# Patient Record
Sex: Female | Born: 1955 | Race: White | Hispanic: No | Marital: Married | Smoking: Never smoker
Health system: Southern US, Community
[De-identification: ages and names within clinical notes are randomized; demographics above are authoritative.]

## PROBLEM LIST (undated history)

## (undated) DIAGNOSIS — N200 Calculus of kidney: Secondary | ICD-10-CM

## (undated) DIAGNOSIS — B009 Herpesviral infection, unspecified: Secondary | ICD-10-CM

## (undated) DIAGNOSIS — G43909 Migraine, unspecified, not intractable, without status migrainosus: Secondary | ICD-10-CM

## (undated) DIAGNOSIS — K589 Irritable bowel syndrome without diarrhea: Secondary | ICD-10-CM

## (undated) DIAGNOSIS — Z780 Asymptomatic menopausal state: Secondary | ICD-10-CM

## (undated) DIAGNOSIS — K219 Gastro-esophageal reflux disease without esophagitis: Secondary | ICD-10-CM

## (undated) DIAGNOSIS — M810 Age-related osteoporosis without current pathological fracture: Secondary | ICD-10-CM

## (undated) HISTORY — DX: Asymptomatic menopausal state: Z78.0

## (undated) HISTORY — PX: BREAST CYST ASPIRATION: SHX578

## (undated) HISTORY — DX: Migraine, unspecified, not intractable, without status migrainosus: G43.909

## (undated) HISTORY — DX: Age-related osteoporosis without current pathological fracture: M81.0

## (undated) HISTORY — PX: ABDOMINAL HYSTERECTOMY: SHX81

## (undated) HISTORY — DX: Irritable bowel syndrome, unspecified: K58.9

## (undated) HISTORY — DX: Calculus of kidney: N20.0

## (undated) HISTORY — DX: Gastro-esophageal reflux disease without esophagitis: K21.9

## (undated) HISTORY — PX: BREAST LUMPECTOMY: SHX2

---

## 1978-05-01 HISTORY — PX: BREAST CYST EXCISION: SHX579

## 1998-05-01 HISTORY — PX: BREAST BIOPSY: SHX20

## 2001-05-01 HISTORY — PX: CYSTECTOMY: SUR359

## 2011-08-24 ENCOUNTER — Inpatient Hospital Stay: Payer: Self-pay | Admitting: *Deleted

## 2011-08-24 LAB — URINALYSIS, COMPLETE
Blood: NEGATIVE
Glucose,UR: NEGATIVE mg/dL (ref 0–75)
Specific Gravity: 1.008 (ref 1.003–1.030)
WBC UR: <1 /HPF (ref 0–5)

## 2011-08-24 LAB — BASIC METABOLIC PANEL
Anion Gap: 10 (ref 7–16)
Calcium, Total: 9.1 mg/dL (ref 8.5–10.1)
Chloride: 110 mmol/L — ABNORMAL HIGH (ref 98–107)
Co2: 24 mmol/L (ref 21–32)
EGFR (Non-African Amer.): >60
Glucose: 122 mg/dL — ABNORMAL HIGH (ref 65–99)
Sodium: 144 mmol/L (ref 136–145)

## 2011-08-24 LAB — CK TOTAL AND CKMB (NOT AT ARMC): CK, Total: 105 U/L (ref 21–215)

## 2011-08-24 LAB — CBC
MCH: 30.4 pg (ref 26.0–34.0)
MCHC: 33.3 g/dL (ref 32.0–36.0)
WBC: 6.9 10*3/uL (ref 3.6–11.0)

## 2011-08-25 LAB — LIPID PANEL
Cholesterol: 153 mg/dL (ref 0–200)
Ldl Cholesterol, Calc: 91 mg/dL (ref 0–100)
VLDL Cholesterol, Calc: 14 mg/dL (ref 5–40)

## 2011-08-25 LAB — TROPONIN I
Troponin-I: 0.02 ng/mL
Troponin-I: <0.02 ng/mL

## 2011-08-25 LAB — BASIC METABOLIC PANEL
Chloride: 111 mmol/L — ABNORMAL HIGH (ref 98–107)
Co2: 21 mmol/L (ref 21–32)
Creatinine: 0.48 mg/dL — ABNORMAL LOW (ref 0.60–1.30)
EGFR (African American): >60
EGFR (Non-African Amer.): >60
Osmolality: 283 (ref 275–301)
Potassium: 3.8 mmol/L (ref 3.5–5.1)
Sodium: 143 mmol/L (ref 136–145)

## 2011-08-25 LAB — CK TOTAL AND CKMB (NOT AT ARMC)
CK, Total: 78 U/L (ref 21–215)
CK, Total: 78 U/L (ref 21–215)
CK-MB: 1.5 ng/mL (ref 0.5–3.6)
CK-MB: 1.8 ng/mL (ref 0.5–3.6)

## 2011-09-06 ENCOUNTER — Ambulatory Visit: Payer: Self-pay | Admitting: Internal Medicine

## 2012-06-04 ENCOUNTER — Encounter: Payer: Self-pay | Admitting: *Deleted

## 2012-06-10 ENCOUNTER — Ambulatory Visit: Payer: Self-pay | Admitting: Cardiovascular Disease

## 2012-08-05 ENCOUNTER — Ambulatory Visit: Payer: Self-pay | Admitting: Cardiovascular Disease

## 2012-09-18 ENCOUNTER — Ambulatory Visit: Payer: Self-pay | Admitting: Cardiovascular Disease

## 2013-11-03 ENCOUNTER — Ambulatory Visit: Payer: Self-pay | Admitting: Gastroenterology

## 2013-11-05 LAB — PATHOLOGY REPORT

## 2014-08-23 NOTE — Consult Note (Signed)
Brief Consult Note: Diagnosis: SVT.   Patient was seen by consultant.   Consult note dictated.   Recommend further assessment or treatment.   Orders entered.   Discussed with Attending MD.   Comments: IMP SVT Tachycardia Palp . PLAN ASA 81 qd B-blockers Metoprolol 25 bib Increase activity ECHO as inpt or out I do not rec antiarrthymic at this point I do not rec EP/Ablation unless recurrence Probably ok to d/c in 24hr F/U cardiology 1-2 weeks Reduce caffenine/OTC allery meds No ETOH/Smoking.  Electronic Signatures: Lujean Amel D (MD)  (Signed 27-Apr-13 06:49)  Authored: Brief Consult Note   Last Updated: 27-Apr-13 06:49 by Yolonda Kida (MD)

## 2014-08-23 NOTE — H&P (Signed)
PATIENT NAME:  Susan Hickman, Susan Hickman MR#:  [id number] DATE OF BIRTH:  07/11/1955  DATE OF ADMISSION:  08/24/2011  ER REFERRING PHYSICIAN: Conni Slipper, MD   PRIMARY CARE PHYSICIAN: Calla Kicks, MD   CHIEF COMPLAINT: Weakness, dizziness, palpitations.   HISTORY OF PRESENT ILLNESS: The patient is a 59 year old female with past medical history of arthritis, allergies, who since yesterday has been feeling an extreme amount of fatigue. Today while at work she developed sudden onset of palpitations associated with lightheadedness and dizziness. The patient felt like she was going to pass out. Therefore, she sat down, which did not relieve the symptoms. She tried to drink some orange juice but was not able to eat or drink anything. She tried to get up and walk and symptoms and pain subsided. Her coworker called EMS, who found the patient to be in supraventricular tachycardia. The patient was given 6 mg of adenosine which converted her back to normal sinus rhythm. The patient denies having any problems with her heart in the past. She has had palpitations in the past which were associated with stress. She has been having a lot of anxiety and stress in her life recently.   INTEGUMENT: She denies excessive use of caffeine. She drinks about two cups of coffee a day and drinks soda very occasionally. She denies taking any diet pills or stimulants or any drug abuse.   ALLERGIES: Talwin, codeine, shellfish.   MEDICATIONS:  1. Multivitamins.  2. Celebrex 200 mg daily as needed.  3. Claritin daily.  4. Flonase daily.   PAST MEDICAL HISTORY:  1. Kidney stones.  2. Allergies. 3. Osteoarthritis.  PAST SURGICAL HISTORY: Hysterectomy.   SOCIAL HISTORY: Denies any history of smoking, alcohol or drug abuse. She works as a Educational psychologist.  She lives with her boyfriend.   FAMILY HISTORY: Mother had osteoarthritis, osteoporosis, hypertension, diabetes, colon cancer, myocardial infarction, status post coronary artery bypass  graft. Father had hypertension, colon cancer, leukemia. Paternal grandmother had cerebrovascular accident. Maternal grandmother had myocardial infarction.    REVIEW OF SYSTEMS: CONSTITUTIONAL: Denies any fever. Reports fatigue and weakness. EYES: Denies any blurred or double vision. ENT: Denies any tinnitus, ear pain. RESPIRATORY: Denies any cough, wheezing. CARDIOVASCULAR: Denies any chest pain. Reports palpitations. GI: Denies any nausea, vomiting, diarrhea, or abdominal pain. GU: Denies any dysuria or hematuria. ENDOCRINE: Denies polyuria, nocturia. HEMATOLOGIC/LYMPHATIC: Denies any anemia or easy bruisability. INTEGUMENTARY: Denies any acne or rash. MUSCULOSKELETAL: Denies any swelling, gout. NEUROLOGICAL: Denies any numbness, weakness. PSYCHIATRIC: Reports anxiety and a lot of stress recently.   PHYSICAL EXAMINATION:  VITAL SIGNS: Temperature 98, heart rate 110, respiratory rate 20, blood pressure 122/77, pulse oximetry 99% on room air.   GENERAL: The patient is a 60 year old female who is mildly anxious, sitting in bed, not having any acute distress.   HEENT: Head: Atraumatic, normocephalic. Eyes: No pallor, icterus, or cyanosis. Pupils are equal, round and reactive to light and accommodation. Extraocular movements intact. ENT: Wet mucous membranes. No oropharyngeal erythema or thrush.   NECK: Supple. No masses. No JVD. No thyromegaly. No lymphadenopathy.   CHEST WALL: No tenderness to palpation. Not using accessory muscles of respiration. No intercostal muscle retractions.   LUNGS: Bilaterally clear to auscultation. No wheezing, rales, or rhonchi.   CARDIOVASCULAR: S1, S2 regular. No murmur, rubs, or gallops.  ABDOMEN: Soft, nontender, nondistended. No guarding, no rigidity. No organomegaly. Normal bowel sounds.   SKIN: No rashes or lesions.   PERIPHERIES: No pedal edema, 2+ pedal pulses.   MUSCULOSKELETAL:  No cyanosis or clubbing.   NEUROLOGICAL: Awake, alert, oriented x3. Nonfocal  neurological exam. Cranial nerves are grossly intact.   PSYCHIATRIC: Mildly anxious.   LABORATORY, DIAGNOSTIC AND RADIOLOGICAL DATA:  EKG is currently normal sinus rhythm.  Troponin 0.06.  CBC normal.  Complete metabolic panel normal other than glucose of 122.  TSH 2.65, normal.  Urinalysis shows no evidence of infection.   ASSESSMENT/PLAN: The patient is a 59 year old female with past medical history of allergies, osteoarthritis, who presents with palpitations, weakness, fatigue, and dizziness, found to be in supraventricular tachycardia.  1. Supraventricular tachycardia: The patient was given 6 mg of adenosine by EMS and converted to normal sinus rhythm. We will admit the patient to the hospital, monitor on telemetry. We will place on p.r.n. Lopressor, get a 2-D echo and Cardiology consultation.  2. Elevated troponin: Possibly due to acute demand ischemia resulting from supraventricular tachycardia. We will check serial cardiac enzymes, place on a baby aspirin.  3. Hyperglycemia, possibly reactive: The patient has no history of diabetes.  4. History of allergies: We will continue Claritin and Flonase.   I discussed with the ED physician, discussed with the patient and her family the plan of care and management.     TIME SPENT:   75 minutes. ____________________________ Cherre Huger, MD sp:cbb D: 08/24/2011 20:34:03 ET T: 08/25/2011 07:28:46 ET JOB#: [id number]  cc: Cherre Huger, MD, <Dictator> Dory Horn. Eliberto Ivory, MD Cherre Huger MD ELECTRONICALLY SIGNED 08/25/2011 17:20

## 2014-08-23 NOTE — Discharge Summary (Signed)
PATIENT NAME:  Susan Hickman, MACCHIA MR#:  [id number] DATE OF BIRTH:  06-15-1955  DATE OF ADMISSION:  08/24/2011 DATE OF DISCHARGE:  08/26/2011  DISCHARGE DIAGNOSES:  1. Paroxysmal supraventricular tachycardia, now in sinus rhythm. 2. Minimally elevated troponin secondary to supraventricular tachycardia. 3. Dizziness and presyncope secondary to supraventricular tachycardia, now resolved.  4. Osteoarthritis.  5. Allergies.  CONSULTANT: Lujean Amel, MD - Cardiology.  HOSPITAL COURSE: This is a 59 year old female fairly healthy with history of osteoarthritis and allergies who presented with weakness, dizziness, and palpitations. When she presented she was complaining of palpitations and she was found to be in supraventricular tachycardia. The patient was given 6 mg of adenosine and the SVT broke with adenosine and she remained in sinus rhythm. She was admitted to telemetry. She remained stable on telemetry. She was started on low dose beta blocker. Her TSH was normal at 2.65. She has minimally elevated troponin, one time at 0.06, but repeat troponins have been negative, most likely secondary to tachycardia. Her electrolytes were stable. Her creatinine remained stable in the range of 0.64 to 0.48. Her magnesium was normal at 1.9. Her hemoglobin A1c was 5.6. Lipid profile was normal. LDL was 91. Chest x-ray at admission showed mild right basilar opacities likely secondary to atelectasis and patient rotation. Her chest is clear. Her echocardiogram shows that the patient had normal LV function with an ejection fraction of greater than 55%, LV wall motion is normal, and right ventricular systolic function normal. So basically normal Echo. Dr. Clayborn Bigness saw her as a cardiology consultant and suggested to continue low dose aspirin and beta blocker and we advised her to reduce her intake of caffeine and avoid over-the-counter allergy medication. We also advised her to cut down her alcohol. Her blood pressure has  been running in the range of 116/72 to 10170 so I will discharge her on metoprolol 12.5 mg twice a day. If her blood pressure allows, her metoprolol can be further advanced as an outpatient. I advised her to followup with cardiology as an outpatient. She may need a stress test as an outpatient considering her strong family history of coronary artery disease.     DISCHARGE MEDICATIONS:  Home Medications:  1. Celebrex 200 mg once daily as needed for pain from osteoarthritis. 2. Claritin 10 mg daily as needed. 3. Flonase nasal spray once a day as needed.  4. Multivitamin daily.   New medications:  1. Ecotrin 81 mg p.o. once daily. 2. Metoprolol 12.5 mg p.o. twice a day.  DIET: Regular.  Decrease caffeine.  DISCHARGE INSTRUCTIONS:  Avoid over-the-counter cough medications.   CONDITION AT DISCHARGE: She is comfortable. She was mildly nauseous this morning, maybe because of [place]. T-max is 98.6, heart rate 77, blood pressure 116/72 to 101/70, and saturating 97% on room air. Chest is clear. Heart sounds are regular. Abdomen soft and nontender.  She is awake, alert, and oriented x3.   DISCHARGE FOLLOWUP: Followup with Dr. Calla Kicks in 1 to 2 weeks and followup with Dr. Clayborn Bigness, cardiology, in 1 to 2 weeks.   TIME SPENT ON DISCHARGE: 40 minutes. ____________________________ Mena Pauls, MD ag:slb D: 08/26/2011 10:36:34 ET     T: 08/26/2011 14:46:53 ET       JOB#: [id number] cc: Mena Pauls, MD, <Dictator> Dory Horn. Eliberto Ivory, MD Dwayne D. Clayborn Bigness, MD Mena Pauls MD ELECTRONICALLY SIGNED 09/06/2011 11:41

## 2014-10-07 ENCOUNTER — Other Ambulatory Visit: Payer: Self-pay | Admitting: Internal Medicine

## 2014-10-07 ENCOUNTER — Ambulatory Visit
Admission: RE | Admit: 2014-10-07 | Discharge: 2014-10-07 | Disposition: A | Discharge status: Home / Self Care | Source: Ambulatory Visit | Attending: Internal Medicine | Admitting: Internal Medicine

## 2014-10-07 DIAGNOSIS — Z1231 Encounter for screening mammogram for malignant neoplasm of breast: Secondary | ICD-10-CM

## 2014-10-07 DIAGNOSIS — I8011 Phlebitis and thrombophlebitis of right femoral vein: Secondary | ICD-10-CM

## 2017-12-24 ENCOUNTER — Other Ambulatory Visit: Payer: Self-pay

## 2017-12-24 ENCOUNTER — Encounter: Payer: Self-pay | Admitting: Physical Therapy

## 2017-12-24 ENCOUNTER — Ambulatory Visit: Payer: BLUE CROSS/BLUE SHIELD | Attending: Obstetrics & Gynecology | Admitting: Physical Therapy

## 2017-12-24 DIAGNOSIS — M533 Sacrococcygeal disorders, not elsewhere classified: Secondary | ICD-10-CM | POA: Diagnosis not present

## 2017-12-24 DIAGNOSIS — R2689 Other abnormalities of gait and mobility: Secondary | ICD-10-CM | POA: Diagnosis present

## 2017-12-24 DIAGNOSIS — R278 Other lack of coordination: Secondary | ICD-10-CM | POA: Diagnosis present

## 2017-12-24 NOTE — Patient Instructions (Addendum)
  Decrease downward forces onto pelvic floor: Log roll out of bed instead of jumping out of bed with a crunch Hold off on crunches and sit ups  Exhale as you lift or as you get out of the chair (against gravity and loads)   Move feet first before you turn   Stand with equal weight on both legs   Proper body mechanics with getting out of a chair to decrease strain  on back &pelvic floor   Avoid holding your breath when Getting out of the chair:  Scoot to front part of chair chair Heels behind feet, feet are hip width apart, nose over toes  Inhale like you are smelling roses Exhale to stand   __________

## 2017-12-25 NOTE — Therapy (Signed)
[place] [facility] SERVICES [address] [place], [place], [address] Phone: [phone]   Fax:  [phone]  Physical Therapy Evaluation  Patient Details  Name: Susan Hickman MRN: [id number] Date of Birth: December 26, 1955 Referring Provider: Larey Days, MD   Encounter Date: 12/24/2017  PT End of Session - 12/25/17 2053    Visit Number  1    Number of Visits  12    Date for PT Re-Evaluation  03/18/18    PT Start Time  1500    PT Stop Time  1603    PT Time Calculation (min)  63 min    Equipment Utilized During Treatment  Gait belt    Activity Tolerance  Patient tolerated treatment well;No increased pain    Behavior During Therapy  WFL for tasks assessed/performed       Past Medical History:  Diagnosis Date  . GERD (gastroesophageal reflux disease)   . Irritable bowel   . Migraine headache   . Nephrolithiasis   . Osteoporosis   . Postmenopausal     Past Surgical History:  Procedure Laterality Date  . ABDOMINAL HYSTERECTOMY     B ovaries intact  . BREAST LUMPECTOMY    . CYSTECTOMY Right 2003   R ovary     There were no vitals filed for this visit.   Subjective Assessment - 12/24/17 1508    Subjective 1) cystocele: Pt noticed she had problems with not holding her bladder well and had urgency. Pt checked her vagina and noticed a balloon -like thing. Pt went to see her gynecologist and she learned that it was her bladder. 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 Pt has cut back lifting and not working as much now. She is down to 25 hours a week instead of 60hours.  Pt has tried a pessary and had one incident where her bladder went around her pessary. Pt and doctor decided to not use the pessary until she has tried physical therapy or if physical therapist deems she will need one or need surgery. Pt is having dfficulty with bowel  movements and she feels she has to wiggle her body position with leaning forward or needing to use her finger to push behind her vagina.  Pt tries not to strain with bowel movement because she has supreventricular tachycardia. Pt is on medication for SVT. Pt had a heart ablation in 2016 and that is why pt has decreased her work schedule with heaving lifting at work.  Pt used to like to walk alot but she feels very limited in what she can do. If she has to go to the bathroom to pee or have a bowel movement, there is no waiting. Pt has had leakage before getting to the bathroom. Pt tried tai chi 2 months ago and liked it. Pt was afraid she would have problems with her bladder with some movements ( Turning R) .          2) LBP for the past month without radiating pain.  Level 3-4/10 . Pt points to both iliac crest area.  It is relieved when she feels she has emptyed urine and bowels all the way.    3) Abdominal pain for the past 1.5 months. Denied vomitting, nausea, bloody stools/ urine. Pt points to her pubic area as the location of her pain. The pain  felt like "a million crunches" and it occurred with lifting her leg . Sleep has been interrupted by this pain. Denied hernia. In the past, pt used to exercise with floor exercises including sit ups and crunches. Pt stopped these exercises after her heart diagnosis of SVT. Pt did not lift weights. For 5 years, pt had to lift her mother ( 230 lbs) during the last years of her life when caring for her parents.       Pertinent History  Vaingal deliveries x 2 with episomtomy and tear from the "behind and up".  Hysterectomy 40 years ago. Pt had one R ovarian cyst removed laproscopically in 2003-4, osteoarthritis in hips, back, hands,     Patient Stated Goals  be able to walk 3 miles without being scared to get to the house in time before leakage, return to tai chi, not to use her figner with complteing bowel movements          [facility] PT Assessment - 12/25/17 2054       Assessment   Medical Diagnosis  vaginal vault prolapse     Referring Provider  Larey Days, MD      Precautions   Precautions  None      Restrictions   Weight Bearing Restrictions  No      Prior Function   Level of Independence  Independent      Observation/Other Assessments   Observations  standing with  more weight on L leg, hip shifted leg      Palpation   Spinal mobility  L lumbar curve, R iliac crest slightly higher than L, R scapular winging with downward rotation.  R rotation limited > L. L shoulder lower than R .  (post-Tx:  R rotation of trunk equal with L full range)     SI assessment   R anterior/ inferior ASIS . leg length R 80.5 cm, L 80 cm. Post Tx: ASIS levelled and not rotated. leg length B 82 cm B )      Palpation comment  tenderness at pubic symphysis ( post Tx: no tenderness).  low abdominal scar restriction L > R                 Objective measurements completed on examination: See above findings.      [place] Adult PT Treatment/Exercise - 12/25/17 2053      Neuro Re-ed    Neuro Re-ed Details   see pt instructions      Manual Therapy   Manual therapy comments  long axis distraction  on R, FADDER , MWM in sidelying, rotational mob Grade III              PT Education - 12/25/17 2056    Education Details  HEP, POC, anatomy, physiology, goals,     Person(s) Educated  Patient    Methods  Explanation;Demonstration;Tactile cues;Verbal cues;Handout    Comprehension  Returned demonstration;Verbalized understanding          PT Long Term Goals - 12/24/17 1517      PT LONG TERM GOAL #1   Title  Pt will decrease the use of her finger to assist with completely emptying her bowel movement from 50% of the time to 25% of the time in order to promote hygiene    Time  6    Period  Weeks    Status  New    Target Date  02/04/18      PT LONG TERM GOAL #2  Title  Pt will decrease her PFDI score from 78% to < 50% in order to have improved  pelvic floor function and the confience to return to tai chi classes     Time  12    Period  Weeks    Status  New    Target Date  03/18/18      PT LONG TERM GOAL #3   Title  Pt will report walking 3 miles without urge nor incontinence in order to maintain health and wellness with exercise     Time  10    Period  Weeks    Status  New    Target Date  03/05/18      PT LONG TERM GOAL #4   Title  Pt will decrease her ODI score from 24% to < 12% in order perform work duties     Time  8    Period  Weeks    Status  New    Target Date  02/19/18      PT LONG TERM GOAL #5   Title  Pt will demo proper body mechanics to minimize straining pelvic floor to minimize lowering pelvic organs and to use co-activation techniques of deep core mm in order to  perform functional activities with less risk for worsening her prolapse     Time  6    Period  Weeks    Status  New    Target Date  02/05/18      Additional Long Term Goals   Additional Long Term Goals  Yes      PT LONG TERM GOAL #6   Title  Pt will demo decreased abdominal scar restrictions, no pelvic obliquities across 2 visits in order to progress to pelvic floor, deep core strengthening exercises    Time  4    Period  Weeks    Status  New             Plan - 12/25/17 2057    Clinical Impression Statement  Pt is a 62 yo female who reports pelvic floor dysfunctions associated with cystocele which include difficulty with completely emptying bowel movements, urgency, urge incontinence. She has tried using a pessary but currently is not using it and would like to try physical therapy. Pt also reports LBP ( onset 1 month ago)  bilaterally at iliac crest region bilaterally without radiating pain in addition to abdominal pain located at her pubic symphysis (onset 1.5 months ago). Denied vomitting, nausea, bloody stools/ urine, nor hernias. Today's PT assessment showed poor intra-abdominal pressure system which is associated with her prolapse,  pelvic floor dysfunctions, LBP, and abdominal/pubic symphysis pain. The following deficits contributing to a poor intra-abdominal pressures system include:  _pelvic obliquities with hypombility  _spinal deviations and limitation with R trunk rotation, unequal shoulder height _poor body mechanics with simulated work tasks, toileting, and against gravity tasks  _abdominal scar restrictions _tenderness with palpation at pubic symphysis   _poor standing posture  Intrapelvic assessment will be performed at upcoming visit. Following today's Tx, pt showed equal alignment of pelvis, increased R trunk rotation, and  No tenderness with palpation at pubic symphysis. Pt demo'd proper techniques for body mechanics with functional activities.      Additional factors that impact her condition: Pt has SVT and is aware to not strain. Pt had an ablation in 2016. Pt used to walk 3 mi and enjoyed attending tai chi classes but has not be doing these activities because she has felt  limited by her Sx. In the past, pt used to exercise with floor exercises including sit ups and crunches. Pt stopped these exercises after her heart diagnosis of SVT. Pt did not lift weights. For 5 years, pt had to lift her mother ( 230 lbs) during the last years of her life when caring for her parents.  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   Pt would benefit from skilled PT. Education on proper ways to exercises and body mechanics techniques will be beneficial to help pt integrate back towards a fitness routine that will be safe for her cardiac condition and optimal for her pelvic/ spinal health.      Clinical Presentation  Evolving    Clinical Decision Making  Moderate    Rehab Potential  Good    PT Frequency  1x / week    PT Duration  12 weeks    PT Treatment/Interventions  Therapeutic  exercise;Neuromuscular re-education;Scar mobilization;Manual techniques;Stair training;Patient/family education;Therapeutic activities;Moist Heat;Passive range of motion    Consulted and Agree with Plan of Care  Patient       Patient will benefit from skilled therapeutic intervention in order to improve the following deficits and impairments:  Abnormal gait, Improper body mechanics, Pain, Postural dysfunction, Increased muscle spasms, Decreased scar mobility, Decreased coordination, Decreased mobility, Decreased endurance, Decreased range of motion, Decreased strength, Impaired perceived functional ability, Impaired UE functional use, Decreased safety awareness, Difficulty walking, Decreased balance, Decreased activity tolerance, Impaired flexibility, Impaired vision/preception, Hypomobility, Increased fascial restricitons  Visit Diagnosis: Sacrococcygeal disorders, not elsewhere classified  Other lack of coordination  Other abnormalities of gait and mobility     Problem List There are no active problems to display for this patient.   Jerl Mina 12/25/2017, 9:02 PM  [place] [facility] SERVICES [address] [place], [place], [address] Phone: [phone]   Fax:  [phone]  Name: Susan Hickman MRN: [id number] Date of Birth: 05-Aug-1955

## 2018-01-01 ENCOUNTER — Ambulatory Visit: Payer: BLUE CROSS/BLUE SHIELD | Attending: Obstetrics & Gynecology | Admitting: Physical Therapy

## 2018-01-01 DIAGNOSIS — M533 Sacrococcygeal disorders, not elsewhere classified: Secondary | ICD-10-CM | POA: Diagnosis present

## 2018-01-01 DIAGNOSIS — R2689 Other abnormalities of gait and mobility: Secondary | ICD-10-CM

## 2018-01-01 DIAGNOSIS — R278 Other lack of coordination: Secondary | ICD-10-CM | POA: Diagnosis present

## 2018-01-01 DIAGNOSIS — R42 Dizziness and giddiness: Secondary | ICD-10-CM | POA: Diagnosis present

## 2018-01-01 NOTE — Patient Instructions (Addendum)
Sleeping in elevated position  One pillow horizontal, one vertical ontop of that pillow so that your neck pain is minimized.   ______  .Susan Hickman level 1 ( handout)   no chest breathing    10 reps in the morning and evening laying reclined on bed   ______     Transition from standing to floor: Wide squat like you are about to pick something up from the floor --> crawl hand down on thigh and then reach other hand onto the ground (all fours)  To get up, all fours--> lifts hips in to Downward Facing Dog  and walk hands backwards to feet --> mini quat --> hands on thighs, then hips then pause HERE  to avoid (moving too quickly up/ blood rush) -->  knees glide forward and roll  Hips up instead of hinging spine up   * KEEP YOUR HEAD AND HEART LEVELLED , NEVER LETTING YOUR HEAD GET BELOW YOUR HEART

## 2018-01-01 NOTE — Therapy (Signed)
[place] MAIN [facility] SERVICES [address] [place], [place], [address] Phone: [phone]   Fax:  [phone]  Physical Therapy Treatment  Patient Details  Name: Susan Hickman MRN: [id number] Date of Birth: 25-Jan-1956 Referring Provider: Larey Days, MD   Encounter Date: 01/01/2018  PT End of Session - 01/01/18 1425    Visit Number  2    Number of Visits  12    Date for PT Re-Evaluation  03/18/18    PT Start Time  0915    PT Stop Time  1005    PT Time Calculation (min)  50 min    Equipment Utilized During Treatment  Gait belt    Activity Tolerance  Patient tolerated treatment well;No increased pain    Behavior During Therapy  WFL for tasks assessed/performed       Past Medical History:  Diagnosis Date  . GERD (gastroesophageal reflux disease)   . Irritable bowel   . Migraine headache   . Nephrolithiasis   . Osteoporosis   . Postmenopausal     Past Surgical History:  Procedure Laterality Date  . ABDOMINAL HYSTERECTOMY     B ovaries intact  . BREAST LUMPECTOMY    . CYSTECTOMY Right 2003   R ovary     Vitals:   01/01/18 0943  BP: 114/76    Subjective Assessment - 01/01/18 0918    Subjective Pt would like to find out what is better for her when bending over. Pt did some weeding in her garden and rolled her clay fower pots. Pt felt uncomfortable in the pelvic area. Pt found it helpful using the technique of getting out of bed and getting out of chair.    Vertigo Sx report: Two weeks ago, pt woke up from being asleep, she had vertigo with "room going round and round".  This past Saturday, pt got out of her car and felt she was veering to the R when walking. She had to hold onto the wall while walking. Pt had vertigo real bad 6 years ago to the point she had to crawl on the floor. Pt had an inner problem then and took medications for an ear infection.  Pt states when she had her tooth extracted 2 year ago, she had a small hole in  her sinus cavity with drainage through her mouth. Finally it healed up. Since that tooth extraction, her R right ear has not felt the same.  Upon further questioning about her cardiac Hx, pt had an EKG for SVT ( Dx 2013) and her EKG and BP were really good.  Pt reported her past Saturday episode of vertigo was NOT accompanied by ( slurred speech, double vision, one-sided weakness, nausea nor vomitting) but the episode two weeks ago ( which occurred upon waking) was accompanied by nausea which lasted 3 hours and her pupils were going back and forth side to side.        Pertinent History  Vaingal deliveries x 2 with episomtomy and tear from the "behind and up".  Hysterectomy 40 years ago. Pt had one R ovarian cyst removed laproscopically in 2003-4, osteoarthritis in hips, back, hands,     Patient Stated Goals  be able to walk 3 miles without being scared to get to the house in time before leakage, return to tai chi, not to use her figner with completing bowel movements          [facility] PT Assessment - 01/01/18 1359  Coordination   Gross Motor Movements are Fluid and Coordinated  --   chest breathing, dyscoordination of pelvic floor     Floor to Stand   Comments  poor technique, alignment                    [facility] Adult PT Treatment/Exercise - 01/01/18 1416      Therapeutic Activites    Other Therapeutic Activities  facilitated       Neuro Re-ed    Neuro Re-ed Details   see pt instructions             PT Education - 01/01/18 1010    Education Details  HEP     Person(s) Educated  Patient    Methods  Explanation;Demonstration;Tactile cues;Verbal cues;Handout    Comprehension  Returned demonstration;Verbalized understanding;Verbal cues required;Tactile cues required          PT Long Term Goals - 01/01/18 1412      PT LONG TERM GOAL #1   Title  Pt will decrease the use of her finger to assist with completely emptying her bowel movement from 50% of the time to  25% of the time in order to promote hygiene    Time  6    Period  Weeks    Status  On-going      PT LONG TERM GOAL #2   Title  Pt will decrease her PFDI score from 78% to < 50% in order to have improved pelvic floor function and the confience to return to tai chi classes     Time  12    Period  Weeks    Status  On-going      PT LONG TERM GOAL #3   Title  Pt will report walking 3 miles without urge nor incontinence in order to maintain health and wellness with exercise     Time  10    Period  Weeks    Status  On-going      PT LONG TERM GOAL #4   Title  Pt will decrease her ODI score from 24% to < 12% in order perform work duties     Time  8    Period  Weeks    Status  On-going      PT LONG TERM GOAL #5   Title  Pt will demo proper body mechanics to minimize straining pelvic floor to minimize lowering pelvic organs and to use co-activation techniques of deep core mm in order to  perform functional activities with less risk for worsening her prolapse     Time  6    Period  Weeks    Status  Partially Met      Additional Long Term Goals   Additional Long Term Goals  Yes      PT LONG TERM GOAL #6   Title  Pt will demo decreased abdominal scar restrictions, no pelvic obliquities across 2 visits in order to progress to pelvic floor, deep core strengthening exercises    Time  4    Period  Weeks    Status  On-going      PT LONG TERM GOAL #7   Title  Pt will report no vertigo episodes across 2weeks in order to resume her ADLs    Time  4    Period  Weeks    Status  New    Target Date  01/29/18            Plan -  01/01/18 1427    Clinical Impression Statement Pelvic Rehab PT update: Pt is making good progress with improved demonstration with against gravity transfers to minimize straining the pelvic floor area. Progressed with deep core coordination exercise to minimize chest breathing and less dyscoordination of pelvic floor. Pt was positioned in a semi-fowler's position due  to pt's recent vertigo Sx/ cardiac Hx.  Pt demo'd correctly following training without complaints.  Addressed safe transfers to ground and standing to apply when working in the garden with proper technique to minimize strain and orthostatic hypotension. Educated pt on maintaining positional changes to minimize head position lower than heart, given pt's cardiac Hx of SVT.     Two recent vertigo episodes that require further assessment by vestibular PT :  Two weeks ago, pt woke up from being asleep, she had vertigo with "room going round and round" that was  accompanied by nausea which lasted 3 hours and her pupils were going back and forth side to side. Denied slurred speech, double vision, one-sided weakness, vomiting. his past Saturday, pt got out of her car and felt she was veering to the R when walking, requiring hand support along wall. This episode was NOT accompanied by slurred speech, double vision, one-sided weakness, nausea nor vomitting.   Pertinent Hx: Pt had vertigo real bad 6 years ago but never received vestibular rehab. Furthermore, she had her tooth extracted 2 year ago which lead to a small hole in her sinus cavity with drainage through her mouth that eventually healed up. Since that tooth extraction complication, pt states her R right ear has not felt the same.  Upon further questionnaire about her cardiac Hx, pt had a recent f/u with her cardiologist EKG for SVT ( Dx 2013) and her EKG and BP were normal.  Pt reported her past Saturday episode of vertigo was NOT accompanied by ( slurred speech, double vision, one-sided weakness, nausea nor vomitting) but the episode two weeks ago ( which occurred upon waking) was accompanied by nausea which lasted 3 hours and her pupils were going back and forth side to side. Today's BP was 114/76 mm Hg.   Dizziness Dx has been added to re-cert with request for MD's order to assess, screen in/out BPPV versus cardiac causes, and treat if appropriate. Pt is  scheduled with vestibular rehab PT this Friday. Communication with vestibular PT has been made ( Dr. Claudie Fisherman) and he plans to communicate with cardiologist and PCP with findings w/ referral out to providers as necessary.   Pt continues to benefit from pelvic PT but plan to resume pelvic PT after pt's vertigo Sx resolves. Pt's next pelvic PT appt is at the end of the month.     Rehab Potential  Good    PT Frequency  2x / week    PT Duration  12 weeks    PT Treatment/Interventions  Therapeutic exercise;Neuromuscular re-education;Scar mobilization;Manual techniques;Stair training;Patient/family education;Therapeutic activities;Moist Heat;Passive range of motion;Canalith Repostioning   Consulted and Agree with Plan of Care  Patient       Patient will benefit from skilled therapeutic intervention in order to improve the following deficits and impairments:  Abnormal gait, Improper body mechanics, Pain, Postural dysfunction, Increased muscle spasms, Decreased scar mobility, Decreased coordination, Decreased mobility, Decreased endurance, Decreased range of motion, Decreased strength, Impaired perceived functional ability, Impaired UE functional use, Decreased safety awareness, Difficulty walking, Decreased balance, Decreased activity tolerance, Impaired flexibility, Impaired vision/preception, Hypomobility, Increased fascial restricitons  Visit Diagnosis: Sacrococcygeal disorders, not elsewhere classified  Other lack of coordination  Other abnormalities of gait and mobility  Dizziness and giddiness     Problem List There are no active problems to display for this patient.   Jerl Mina ,PT, DPT, E-RYT  01/02/2018, 10:51 AM  [place] MAIN [facility] SERVICES [address] [place], [place], [address] Phone: [phone]   Fax:  [phone]  Name: Susan Hickman MRN: [id number] Date of Birth: 05-Mar-1956

## 2018-01-04 ENCOUNTER — Ambulatory Visit: Payer: BLUE CROSS/BLUE SHIELD

## 2018-01-04 DIAGNOSIS — R42 Dizziness and giddiness: Secondary | ICD-10-CM

## 2018-01-04 NOTE — Therapy (Signed)
[place] MAIN [facility] SERVICES [address] [place], [place], [address] Phone: [phone]   Fax:  [phone]  Physical Therapy Treatment  Patient Details  Name: Susan Hickman MRN: [id number] Date of Birth: May 13, 1955 Referring Provider: Larey Days, MD   Encounter Date: 01/04/2018    Past Medical History:  Diagnosis Date  . GERD (gastroesophageal reflux disease)   . Irritable bowel   . Migraine headache   . Nephrolithiasis   . Osteoporosis   . Postmenopausal     Past Surgical History:  Procedure Laterality Date  . ABDOMINAL HYSTERECTOMY     B ovaries intact  . BREAST LUMPECTOMY    . CYSTECTOMY Right 2003   R ovary     There were no vitals filed for this visit.     Pt arrived for her appointment however did not want therapist to evaluate her dizziness. She is concerned that it may trigger her SVT and this is very anxiety provoking for her. She prefers to see her PCP or go to ENT first before proceeding with any vestibular therapy. Therapist spent time with patient explaining the different options for assessing her dizziness. Pt would like to see ENT. Therapist agrees. Will rescheduled vestibular evaluation once pt feels comfortable proceeding. Pt will continue with her pelvic health therapy.                             PT Long Term Goals - 01/01/18 1412      PT LONG TERM GOAL #1   Title  Pt will decrease the use of her finger to assist with completely emptying her bowel movement from 50% of the time to 25% of the time in order to promote hygiene    Time  6    Period  Weeks    Status  On-going      PT LONG TERM GOAL #2   Title  Pt will decrease her PFDI score from 78% to < 50% in order to have improved pelvic floor function and the confience to return to tai chi classes     Time  12    Period  Weeks    Status  On-going      PT LONG TERM GOAL #3   Title  Pt will report walking 3 miles without urge  nor incontinence in order to maintain health and wellness with exercise     Time  10    Period  Weeks    Status  On-going      PT LONG TERM GOAL #4   Title  Pt will decrease her ODI score from 24% to < 12% in order perform work duties     Time  8    Period  Weeks    Status  On-going      PT LONG TERM GOAL #5   Title  Pt will demo proper body mechanics to minimize straining pelvic floor to minimize lowering pelvic organs and to use co-activation techniques of deep core mm in order to  perform functional activities with less risk for worsening her prolapse     Time  6    Period  Weeks    Status  Partially Met      Additional Long Term Goals   Additional Long Term Goals  Yes      PT LONG TERM GOAL #6   Title  Pt will demo decreased abdominal scar restrictions, no  pelvic obliquities across 2 visits in order to progress to pelvic floor, deep core strengthening exercises    Time  4    Period  Weeks    Status  On-going      PT LONG TERM GOAL #7   Title  Pt will report no vertigo episodes across 2weeks in order to resume her ADLs    Time  4    Period  Weeks    Status  New    Target Date  01/29/18              Patient will benefit from skilled therapeutic intervention in order to improve the following deficits and impairments:     Visit Diagnosis: Dizziness and giddiness     Problem List There are no active problems to display for this patient.  Phillips Grout PT, DPT, GCS  Mandi Mattioli 01/04/2018, 10:23 AM  [place] MAIN [facility] SERVICES [address] [place], [place], [address] Phone: [phone]   Fax:  [phone]  Name: Susan Hickman MRN: [id number] Date of Birth: 1956/04/09

## 2018-01-28 ENCOUNTER — Ambulatory Visit: Payer: BLUE CROSS/BLUE SHIELD | Admitting: Physical Therapy

## 2018-01-28 DIAGNOSIS — M533 Sacrococcygeal disorders, not elsewhere classified: Secondary | ICD-10-CM | POA: Diagnosis not present

## 2018-01-28 DIAGNOSIS — R278 Other lack of coordination: Secondary | ICD-10-CM

## 2018-01-28 DIAGNOSIS — R2689 Other abnormalities of gait and mobility: Secondary | ICD-10-CM

## 2018-01-28 NOTE — Patient Instructions (Signed)
Deep core level 1 and 2      Stretch for pelvic floor   "v heels slide away and then back toward buttocks and then rock knee to slight ,  slide heel along at 11 o clock away from buttocks   10 reps      Ask husband to make his side of the bed so you avoid pulling   Unravel garden hose at every 10 feet like tug a war position and then walk with it

## 2018-01-28 NOTE — Therapy (Signed)
[place] MAIN [facility] SERVICES [address] [place], [place], [address] Phone: [phone]   Fax:  [phone]  Physical Therapy Treatment  Patient Details  Name: Susan Hickman MRN: [id number] Date of Birth: October 14, 1955 Referring Provider (PT): Larey Days, MD   Encounter Date: 01/28/2018  PT End of Session - 01/28/18 1232    Visit Number  3    Number of Visits  12    Date for PT Re-Evaluation  03/18/18    PT Start Time  1005    PT Stop Time  1106    PT Time Calculation (min)  61 min    Equipment Utilized During Treatment  Gait belt    Activity Tolerance  Patient tolerated treatment well;No increased pain    Behavior During Therapy  WFL for tasks assessed/performed       Past Medical History:  Diagnosis Date  . GERD (gastroesophageal reflux disease)   . Irritable bowel   . Migraine headache   . Nephrolithiasis   . Osteoporosis   . Postmenopausal     Past Surgical History:  Procedure Laterality Date  . ABDOMINAL HYSTERECTOMY     B ovaries intact  . BREAST LUMPECTOMY    . CYSTECTOMY Right 2003   R ovary     There were no vitals filed for this visit.  Subjective Assessment - 01/28/18 1008    Subjective  Pt has had no more vertigo. Pt has seen her ENT doctor. Pt notices her pressure sensation more in teh morning after she hasm ade her king size bed which involves pulling the sheet on her husband's side of the bed. Pt also notices the feeling when pulling her garden hose     Pertinent History  Vaingal deliveries x 2 with episomtomy and tear from the "behind and up".  Hysterectomy 40 years ago. Pt had one R ovarian cyst removed laproscopically in 2003-4, osteoarthritis in hips, back, hands,     Patient Stated Goals  be able to walk 3 miles without being scared to get to the house in time before leakage, return to tai chi, not to use her figner with complteing bowel movements          [facility] PT Assessment - 01/28/18 1232       Observation/Other Assessments   Observations  simulated gardening task: pulling garden hose       Coordination   Gross Motor Movements are Fluid and Coordinated  --   chest breathing, ab oversuse w/ exhalation      Sit to Stand   Comments  genu valgus and breathholding      Palpation   Palpation comment  levelled iliac crest                 Pelvic Floor Special Questions - 01/28/18 1102    Prolapse  Anterior Wall;Posterior Wall   within introitus,   Pelvic Floor Internal Exam  pt consented verbally without contraindications     Exam Type  Vaginal    Palpation  tenderness and tensions at L 5 oclock with scar restrictions at 2nd layer. ( decreased post Tx)     Strength  weak squeeze, no lift    Strength # of reps  --   withheld kegels due to dyscoordination. overactivity        [facility] Adult PT Treatment/Exercise - 01/28/18 1231      Neuro Re-ed    Neuro Re-ed Details   see pt instructions  Manual Therapy   Manual therapy comments  fascial releases over supra pubic area     Internal Pelvic Floor  MWM, STM at 5 o'clock position of scar,              PT Education - 01/28/18 1231    Education Details  HEP    Person(s) Educated  Patient    Methods  Explanation;Demonstration;Tactile cues;Verbal cues;Handout    Comprehension  Returned demonstration;Verbalized understanding          PT Long Term Goals - 01/01/18 1412      PT LONG TERM GOAL #1   Title  Pt will decrease the use of her finger to assist with completely emptying her bowel movement from 50% of the time to 25% of the time in order to promote hygiene    Time  6    Period  Weeks    Status  On-going      PT LONG TERM GOAL #2   Title  Pt will decrease her PFDI score from 78% to < 50% in order to have improved pelvic floor function and the confience to return to tai chi classes     Time  12    Period  Weeks    Status  On-going      PT LONG TERM GOAL #3   Title  Pt will report walking 3  miles without urge nor incontinence in order to maintain health and wellness with exercise     Time  10    Period  Weeks    Status  On-going      PT LONG TERM GOAL #4   Title  Pt will decrease her ODI score from 24% to < 12% in order perform work duties     Time  8    Period  Weeks    Status  On-going      PT LONG TERM GOAL #5   Title  Pt will demo proper body mechanics to minimize straining pelvic floor to minimize lowering pelvic organs and to use co-activation techniques of deep core mm in order to  perform functional activities with less risk for worsening her prolapse     Time  6    Period  Weeks    Status  Partially Met      Additional Long Term Goals   Additional Long Term Goals  Yes      PT LONG TERM GOAL #6   Title  Pt will demo decreased abdominal scar restrictions, no pelvic obliquities across 2 visits in order to progress to pelvic floor, deep core strengthening exercises    Time  4    Period  Weeks    Status  On-going      PT LONG TERM GOAL #7   Title  Pt will report no vertigo episodes across 2weeks in order to resume her ADLs    Time  4    Period  Weeks    Status  New    Target Date  01/29/18            Plan - 01/28/18 1234    Clinical Impression Statement  Pt returns to Pelvic PT with report that her vertigo has resolved and she has seen her ENT doctor. Today, pt showed increased scar restrictions on L posterior pelvic floor mm, limited cranial movement of pelvic floor, and dysccordination of deep core. Pt tolerated intravaginal manual techniques to decrease scar restrictions. Withheld kegels due to pt requiring more training  on minimizing chest breathing and overuse of abdominal mm. Pt demo'd increased ROM of pelvic floor post Tx. Progressed to deep core level 1 and 2 with pelvic floor stretches for her HEP.  Withheld anti-gravity position with deep core exercises to facilitate a more cranial position of pelvic organs ( no pillow under hips)  because pt has  cardiac Hx. Maintaining pt in neutral position. Pt continues to benefit from skilled PT.     Rehab Potential  Good    PT Frequency  2x / week    PT Duration  12 weeks    PT Treatment/Interventions  Therapeutic exercise;Neuromuscular re-education;Scar mobilization;Manual techniques;Stair training;Patient/family education;Therapeutic activities;Moist Heat;Passive range of motion;Canalith Repostioning    Consulted and Agree with Plan of Care  Patient       Patient will benefit from skilled therapeutic intervention in order to improve the following deficits and impairments:  Abnormal gait, Improper body mechanics, Pain, Postural dysfunction, Increased muscle spasms, Decreased scar mobility, Decreased coordination, Decreased mobility, Decreased endurance, Decreased range of motion, Decreased strength, Impaired perceived functional ability, Impaired UE functional use, Decreased safety awareness, Difficulty walking, Decreased balance, Decreased activity tolerance, Impaired flexibility, Impaired vision/preception, Hypomobility, Increased fascial restricitons  Visit Diagnosis: Sacrococcygeal disorders, not elsewhere classified  Other lack of coordination  Other abnormalities of gait and mobility     Problem List There are no active problems to display for this patient.   Jerl Mina ,PT, DPT, E-RYT  01/28/2018, 12:38 PM  [place] MAIN [facility] SERVICES [address] [place], [place], [address] Phone: [phone]   Fax:  [phone]  Name: Susan Hickman MRN: [id number] Date of Birth: 03-12-1956

## 2018-02-04 ENCOUNTER — Encounter: Payer: BLUE CROSS/BLUE SHIELD | Admitting: Physical Therapy

## 2018-02-11 ENCOUNTER — Ambulatory Visit: Payer: BLUE CROSS/BLUE SHIELD | Attending: Obstetrics & Gynecology | Admitting: Physical Therapy

## 2018-02-11 DIAGNOSIS — M533 Sacrococcygeal disorders, not elsewhere classified: Secondary | ICD-10-CM | POA: Insufficient documentation

## 2018-02-11 DIAGNOSIS — R2689 Other abnormalities of gait and mobility: Secondary | ICD-10-CM | POA: Diagnosis present

## 2018-02-11 DIAGNOSIS — R278 Other lack of coordination: Secondary | ICD-10-CM | POA: Insufficient documentation

## 2018-02-11 NOTE — Patient Instructions (Signed)
  _______ Deep core 1   Practice proper pelvic floor coordination  Inhale: expand pelvic floor muscles Exhale" "j" scoop, allow pelvic floor to close, lift first before belly sinks   ( not "draw abdominal muscle to spine" or strain with abdominal muscles")   ______  Abdominal massage upward from L,  R , center to belly button 3 stroke x 3 , pressure is gentle and light with all fingers flat, not using finger tips   ____   Deep core  2 ( 6 min)

## 2018-02-11 NOTE — Therapy (Signed)
[place] MAIN [facility] SERVICES [address] [place], [place], [address] Phone: [phone]   Fax:  [phone]  Physical Therapy Treatment  Patient Details  Name: Susan Hickman MRN: [id number] Date of Birth: 23-Oct-1955 Referring Provider (PT): Larey Days, MD   Encounter Date: 02/11/2018  PT End of Session - 02/11/18 1145    Visit Number  4    Number of Visits  12    Date for PT Re-Evaluation  03/18/18    PT Start Time  7867    PT Stop Time  1115    PT Time Calculation (min)  60 min    Equipment Utilized During Treatment  Gait belt    Activity Tolerance  Patient tolerated treatment well;No increased pain    Behavior During Therapy  WFL for tasks assessed/performed       Past Medical History:  Diagnosis Date  . GERD (gastroesophageal reflux disease)   . Irritable bowel   . Migraine headache   . Nephrolithiasis   . Osteoporosis   . Postmenopausal     Past Surgical History:  Procedure Laterality Date  . ABDOMINAL HYSTERECTOMY     B ovaries intact  . BREAST LUMPECTOMY    . CYSTECTOMY Right 2003   R ovary     There were no vitals filed for this visit.  Subjective Assessment - 02/11/18 1018    Subjective  pt reports she has felt sore with the exercsies. Pt notices pain on the R groin area with walking which has been consistent since prior to PT.  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 This pain subsided evenutally but pt had to be out of work for 2 months. Pt did stayed on the couch and bed alot. Pt was able to resume work and lifting activities after it healed. Pt noticed this R groin pain returned with teh prolapse symptoms at the same level of pain 8/10 at worst, 5/10 avaerage.       Pertinent History  Vaingal deliveries x 2 with episomtomy and tear from the "behind and up".  Hysterectomy 40 years ago. Pt had one R ovarian cyst  removed laproscopically in 2003-4, osteoarthritis in hips, back, hands,     Patient Stated Goals  be able to walk 3 miles without being scared to get to the house in time before leakage, return to tai chi, not to use her figner with complteing bowel movements          [facility] PT Assessment - 02/11/18 1143      Palpation   Palpation comment  L abdominal scar restrictions > R ,                 Pelvic Floor Special Questions - 02/11/18 1142    External Palpation  sever tenderness at L  bulbospongiosus ( tearful) withheld Tx immediately at this area and resorted to working on decreasing abdominal scar on L     Prolapse  Anterior Wall   within introitus, urethra   Pelvic Floor Internal Exam  pt consented verbally without contraindications     Exam Type  Vaginal    Palpation  tenderness and tensions at L piriformis, obt int fascia R ( decreased postTx)     Strength  fair squeeze, definite lift   anterior wall limited by abdominal scar restrictions   Strength # of reps  --  improved coordination       [facility] Adult PT Treatment/Exercise - 02/11/18 1144      Neuro Re-ed    Neuro Re-ed Details   see pt instructions      Modalities   Modalities  Moist Heat      Moist Heat Therapy   Number Minutes Moist Heat  5 Minutes    Moist Heat Location  --   perineum ( sheets/pillow case), low abdominal region     Manual Therapy   Manual therapy comments  fascial releases over L abdominal scar     Internal Pelvic Floor  MWM, STM at problem areas noted in assessment                   PT Long Term Goals - 02/11/18 1027      PT LONG TERM GOAL #1   Title  Pt will decrease the use of her finger to assist with completely emptying her bowel movement from 50% of the time to 25% of the time in order to promote hygiene  ( 10/14: pt no longer has to 100% of the time with bowel movements)     Time  6    Period  Weeks    Status  Achieved      PT LONG TERM GOAL #2   Title  Pt will  decrease her PFDI score from 78% to < 50% in order to have improved pelvic floor function and the confience to return to tai chi classes     Time  12    Period  Weeks    Status  On-going      PT LONG TERM GOAL #3   Title  Pt will report walking 3 miles without urge nor incontinence in order to maintain health and wellness with exercise     Time  10    Period  Weeks    Status  On-going      PT LONG TERM GOAL #4   Title  Pt will decrease her ODI score from 24% to < 12% in order perform work duties     Time  8    Period  Weeks    Status  On-going      PT LONG TERM GOAL #5   Title  Pt will demo proper body mechanics to minimize straining pelvic floor to minimize lowering pelvic organs and to use co-activation techniques of deep core mm in order to  perform functional activities with less risk for worsening her prolapse     Time  6    Period  Weeks    Status  Partially Met      Additional Long Term Goals   Additional Long Term Goals  Yes      PT LONG TERM GOAL #6   Title  Pt will demo decreased abdominal scar restrictions, no pelvic obliquities across 2 visits in order to progress to pelvic floor, deep core strengthening exercises    Time  4    Period  Weeks    Status  On-going      PT LONG TERM GOAL #7   Title  Pt will report no vertigo episodes across 2weeks in order to resume her ADLs    Time  4    Period  Weeks    Status  Achieved      PT LONG TERM GOAL #8   Title  Pt will report with < 2/10 pain R groin pain with walking > 10  steps, and without using hand placement with sneezing and coughing in order to progress to walking to longer distances and coughing/ sneezing with better intraabdominal pressure.      Time  4    Period  Weeks    Status  New    Target Date  03/11/18            Plan - 02/11/18 1146    Clinical Impression Statement  Pt demo'd decreased perienal scar restrictions today which is a good carry over from previous visit. Addressed increased tightness  at deeper layers B with intravaginal Tx and at abdominal scar restrictions with external Tx. Pt toelrated without complaints.  Withheld Tx externally at L bulbospongiosus due to pain and deferred to addressing abdominal scar restrictions. Anticipate L bulbospongious mm will decrease in tensions/ tenderness after abdominal scar restrictions decrease.  Pt demo'd improved coordination with deep core mm after excessive cues for less overuse of oblique mm. Pelvic floor ROM and strength improved post Tx. Pt continues to benefit from skilled PT.  Plan to provide hip strengthening exercises next session.      Rehab Potential  Good    PT Frequency  2x / week    PT Duration  12 weeks    PT Treatment/Interventions  Therapeutic exercise;Neuromuscular re-education;Scar mobilization;Manual techniques;Stair training;Patient/family education;Therapeutic activities;Moist Heat;Passive range of motion;Canalith Repostioning    Consulted and Agree with Plan of Care  Patient       Patient will benefit from skilled therapeutic intervention in order to improve the following deficits and impairments:  Abnormal gait, Improper body mechanics, Pain, Postural dysfunction, Increased muscle spasms, Decreased scar mobility, Decreased coordination, Decreased mobility, Decreased endurance, Decreased range of motion, Decreased strength, Impaired perceived functional ability, Impaired UE functional use, Decreased safety awareness, Difficulty walking, Decreased balance, Decreased activity tolerance, Impaired flexibility, Impaired vision/preception, Hypomobility, Increased fascial restricitons  Visit Diagnosis: Sacrococcygeal disorders, not elsewhere classified  Other lack of coordination  Other abnormalities of gait and mobility     Problem List There are no active problems to display for this patient.   Jerl Mina ,PT, DPT, E-RYT  02/11/2018, 11:49 AM  [place] MAIN [facility]  SERVICES [address] [place], [place], [address] Phone: [phone]   Fax:  [phone]  Name: SEANNE CHIRICO MRN: [id number] Date of Birth: 10/19/1955

## 2018-02-18 ENCOUNTER — Encounter: Payer: BLUE CROSS/BLUE SHIELD | Admitting: Physical Therapy

## 2018-03-11 ENCOUNTER — Ambulatory Visit: Payer: BLUE CROSS/BLUE SHIELD | Attending: Obstetrics & Gynecology | Admitting: Physical Therapy

## 2018-03-11 DIAGNOSIS — R278 Other lack of coordination: Secondary | ICD-10-CM | POA: Diagnosis present

## 2018-03-11 DIAGNOSIS — R2689 Other abnormalities of gait and mobility: Secondary | ICD-10-CM | POA: Insufficient documentation

## 2018-03-11 DIAGNOSIS — M533 Sacrococcygeal disorders, not elsewhere classified: Secondary | ICD-10-CM | POA: Diagnosis not present

## 2018-03-11 NOTE — Therapy (Signed)
[place] MAIN [facility] SERVICES [address] [place], [place], [address] Phone: [phone]   Fax:  [phone]  Physical Therapy Treatment  Patient Details  Name: Susan Hickman MRN: [id number] Date of Birth: February 21, 1956 Referring Provider (PT): Larey Days, MD   Encounter Date: 03/11/2018  PT End of Session - 03/11/18 1702    Visit Number  5    Number of Visits  12    Date for PT Re-Evaluation  03/18/18    PT Start Time  1603    PT Stop Time  1704    PT Time Calculation (min)  61 min    Equipment Utilized During Treatment  Gait belt    Activity Tolerance  Patient tolerated treatment well;No increased pain    Behavior During Therapy  WFL for tasks assessed/performed       Past Medical History:  Diagnosis Date  . GERD (gastroesophageal reflux disease)   . Irritable bowel   . Migraine headache   . Nephrolithiasis   . Osteoporosis   . Postmenopausal     Past Surgical History:  Procedure Laterality Date  . ABDOMINAL HYSTERECTOMY     B ovaries intact  . BREAST LUMPECTOMY    . CYSTECTOMY Right 2003   R ovary     There were no vitals filed for this visit.  Subjective Assessment - 03/11/18 1650    Subjective  Pt reports she had no pelvic pain during activities, but did notice prolapse when shaving her leg with leg propped up.      Pertinent History  Vaingal deliveries x 2 with episomtomy and tear from the "behind and up".  Hysterectomy 40 years ago. Pt had one R ovarian cyst removed laproscopically in 2003-4, osteoarthritis in hips, back, hands,     Patient Stated Goals  be able to walk 3 miles without being scared to get to the house in time before leakage, return to tai chi, not to use her figner with complteing bowel movements          [facility] PT Assessment - 03/11/18 1657      Coordination   Gross Motor Movements are Fluid and Coordinated  --   cued for posterior tilt, improved posterior diaphragmatic ex     Palpation   Palpation comment                   Pelvic Floor Special Questions - 03/11/18 1658    External Palpation  distal to pubic symphysis, minor tensions/ tenderness bulbospongiosus    Prolapse  Anterior Wall   cranial to pubic symphysis    Pelvic Floor Internal Exam  pt consented verbally without contraindications     Exam Type  Vaginal    Palpation  perineal scar tensions 7-8 oclock deep layers, pirifimormis R, tenderness/ tensions at anterior wall L 1 o'clock     Strength  fair squeeze, definite lift    Strength # of reps  --   withheld due to ab overuse, dyssynergia        [facility] Adult PT Treatment/Exercise - 03/11/18 1700      Neuro Re-ed    Neuro Re-ed Details   pt instructions      Modalities   Modalities  Moist Heat      Moist Heat Therapy   Number Minutes Moist Heat  5 Minutes    Moist Heat Location  Other (comment)      Manual Therapy   Internal Pelvic Floor  MWM,  STM at problem areas noted in assessment                   PT Long Term Goals - 02/11/18 1027      PT LONG TERM GOAL #1   Title  Pt will decrease the use of her finger to assist with completely emptying her bowel movement from 50% of the time to 25% of the time in order to promote hygiene  ( 10/14: pt no longer has to 100% of the time with bowel movements)     Time  6    Period  Weeks    Status  Achieved      PT LONG TERM GOAL #2   Title  Pt will decrease her PFDI score from 78% to < 50% in order to have improved pelvic floor function and the confience to return to tai chi classes     Time  12    Period  Weeks    Status  On-going      PT LONG TERM GOAL #3   Title  Pt will report walking 3 miles without urge nor incontinence in order to maintain health and wellness with exercise     Time  10    Period  Weeks    Status  On-going      PT LONG TERM GOAL #4   Title  Pt will decrease her ODI score from 24% to < 12% in order perform work duties     Time  8    Period  Weeks    Status   On-going      PT LONG TERM GOAL #5   Title  Pt will demo proper body mechanics to minimize straining pelvic floor to minimize lowering pelvic organs and to use co-activation techniques of deep core mm in order to  perform functional activities with less risk for worsening her prolapse     Time  6    Period  Weeks    Status  Partially Met      Additional Long Term Goals   Additional Long Term Goals  Yes      PT LONG TERM GOAL #6   Title  Pt will demo decreased abdominal scar restrictions, no pelvic obliquities across 2 visits in order to progress to pelvic floor, deep core strengthening exercises    Time  4    Period  Weeks    Status  On-going      PT LONG TERM GOAL #7   Title  Pt will report no vertigo episodes across 2weeks in order to resume her ADLs    Time  4    Period  Weeks    Status  Achieved      PT LONG TERM GOAL #8   Title  Pt will report with < 2/10 pain R groin pain with walking > 10 steps, and without using hand placement with sneezing and coughing in order to progress to walking to longer distances and coughing/ sneezing with better intraabdominal pressure.      Time  4    Period  Weeks    Status  New    Target Date  03/11/18            Plan - 03/11/18 1702    Clinical Impression Statement  Pt showed decreased perineal scar restrictions and L anterior pelvic floor tightenss with a more cranial position of pelvic organs post Tx. Pt demo'd less overuse of abdominal mm with pelvic  floor coordinaiton and elicitied a more proper lift of pelvic floor with exhalation. Withholding epvlci floor strengthening. Pt continues to benefit from skilled PT.      Rehab Potential  Good    PT Frequency  2x / week    PT Duration  12 weeks    PT Treatment/Interventions  Therapeutic exercise;Neuromuscular re-education;Scar mobilization;Manual techniques;Stair training;Patient/family education;Therapeutic activities;Moist Heat;Passive range of motion;Canalith Repostioning     Consulted and Agree with Plan of Care  Patient       Patient will benefit from skilled therapeutic intervention in order to improve the following deficits and impairments:  Abnormal gait, Improper body mechanics, Pain, Postural dysfunction, Increased muscle spasms, Decreased scar mobility, Decreased coordination, Decreased mobility, Decreased endurance, Decreased range of motion, Decreased strength, Impaired perceived functional ability, Impaired UE functional use, Decreased safety awareness, Difficulty walking, Decreased balance, Decreased activity tolerance, Impaired flexibility, Impaired vision/preception, Hypomobility, Increased fascial restricitons  Visit Diagnosis: Sacrococcygeal disorders, not elsewhere classified  Other lack of coordination  Other abnormalities of gait and mobility     Problem List There are no active problems to display for this patient.   Jerl Mina ,PT, DPT, E-RYT  03/11/2018, 5:04 PM  [place] MAIN [facility] SERVICES [address] [place], [place], [address] Phone: [phone]   Fax:  [phone]  Name: MARCILLE BARMAN MRN: [id number] Date of Birth: [birth year]

## 2018-03-11 NOTE — Patient Instructions (Addendum)
Practice proper pelvic floor coordination  Inhale: expand pelvic floor muscles Exhale" "j" scoop, allow pelvic floor to close, lift first before belly sinks   ( not "draw abdominal muscle to spine" or strain with abdominal muscles")  _____   Stretch for pelvic floor   "v heels slide away and then back toward buttocks and then rock knee to slight ,  slide heel along at 11 o clock away from buttocks   10 reps   _____  Sitting with more weight onto sitting bones and less on anterior thigh Practice diaphragmatic breathing with more posterior ( like tree rings)

## 2018-03-18 ENCOUNTER — Ambulatory Visit: Payer: BLUE CROSS/BLUE SHIELD | Admitting: Physical Therapy

## 2018-03-18 DIAGNOSIS — R278 Other lack of coordination: Secondary | ICD-10-CM

## 2018-03-18 DIAGNOSIS — M533 Sacrococcygeal disorders, not elsewhere classified: Secondary | ICD-10-CM

## 2018-03-18 DIAGNOSIS — R2689 Other abnormalities of gait and mobility: Secondary | ICD-10-CM

## 2018-03-18 NOTE — Patient Instructions (Signed)
   WALKING WITH RESISTANCE BLUE Band at waist connected to doorknob Stepping forward normal length steps, planting mid and forefoot down, center of mass ( navel) leans forward slightly as if you were walking uphill 3-4 steps till band feels taut ( MAKE SURE THE DOOR IS LOCKED AND WON'T OPEN)   Stepping backwards, lower heel slowly, carry trunk and hips back as you step  3 min-6 min   __    Pyramid massage 3 reps over ab scar  ___  Deep core level 1 and 2 ( notice the telescopic ( sequential movent with J-scoop)  -----   Walking with notice of lengthened breath

## 2018-03-18 NOTE — Therapy (Signed)
[place] MAIN [facility] SERVICES [address] [place], [place], [address] Phone: [phone]   Fax:  [phone]  Physical Therapy Treatment  Patient Details  Name: Susan Hickman MRN: [id number] Date of Birth: 1955-09-14 Referring Provider (PT): Larey Days, MD   Encounter Date: 03/18/2018    Past Medical History:  Diagnosis Date  . GERD (gastroesophageal reflux disease)   . Irritable bowel   . Migraine headache   . Nephrolithiasis   . Osteoporosis   . Postmenopausal     Past Surgical History:  Procedure Laterality Date  . ABDOMINAL HYSTERECTOMY     B ovaries intact  . BREAST LUMPECTOMY    . CYSTECTOMY Right 2003   R ovary     There were no vitals filed for this visit.  Subjective Assessment - 03/18/18 1120    Subjective  Pt reports she had no pelvic pain during activities, but did notice prolapse when shaving her leg with leg propped up.      Pertinent History  Vaingal deliveries x 2 with episomtomy and tear from the "behind and up".  Hysterectomy 40 years ago. Pt had one R ovarian cyst removed laproscopically in 2003-4, osteoarthritis in hips, back, hands,     Patient Stated Goals  be able to walk 3 miles without being scared to get to the house in time before leakage, return to tai chi, not to use her figner with complteing bowel movements          [facility] PT Assessment - 03/18/18 1210      Palpation   Palpation comment  abdominal scar restriction                 Pelvic Floor Special Questions - 03/18/18 0001    Prolapse  Anterior Wall   cranial suprapubic, closure post Tx   Pelvic Floor Internal Exam  pt consented verbally without contraindications     Exam Type  Vaginal    Palpation  no perineal scar tensions 7-8 oclock deep layers, pirifimormis R, tenderness/ tensions at anterior wall L 1 o'clock .  minor tenderness at 11 o'clock,     Strength  good squeeze, good lift, able to hold agaisnt strong resistance     Strength # of reps  --   no  ab overuse       [facility] Adult PT Treatment/Exercise - 03/18/18 0001      Neuro Re-ed    Neuro Re-ed Details   pt instructions      Manual Therapy   Manual therapy comments  fascial releases over  abdominal scar with MWM      Internal Pelvic Floor  MWM, STM  to facilitate more cranial movement of anterior pelvic floor                   PT Long Term Goals - 03/18/18 1121      PT LONG TERM GOAL #1   Title  Pt will decrease the use of her finger to assist with completely emptying her bowel movement from 50% of the time to 25% of the time in order to promote hygiene  ( 10/14: pt no longer has to 100% of the time with bowel movements)     Time  6    Period  Weeks    Status  Achieved      PT LONG TERM GOAL #2   Title  Pt will decrease her PFDI score from 78% to < 50% in  order to have improved pelvic floor function and the confience to return to tai chi classes  ( 11/18: 12%)     Time  12    Period  Weeks    Status  Achieved      PT LONG TERM GOAL #3   Title  Pt will report walking 3 miles without urge nor incontinence in order to maintain health and wellness with exercise     Time  10    Period  Weeks    Status  On-going      PT LONG TERM GOAL #4   Title  Pt will decrease her ODI score from 24% to < 12% in order perform work duties     Time  8    Period  Weeks    Status  On-going      PT LONG TERM GOAL #5   Title  Pt will demo proper body mechanics to minimize straining pelvic floor to minimize lowering pelvic organs and to use co-activation techniques of deep core mm in order to  perform functional activities with less risk for worsening her prolapse     Time  6    Period  Weeks    Status  Partially Met      PT LONG TERM GOAL #6   Title  Pt will demo decreased abdominal scar restrictions, no pelvic obliquities across 2 visits in order to progress to pelvic floor, deep core strengthening exercises    Time  4    Period  Weeks     Status  Achieved      PT LONG TERM GOAL #7   Title  Pt will report no vertigo episodes across 2weeks in order to resume her ADLs    Time  4    Period  Weeks    Status  Achieved      PT LONG TERM GOAL #8   Title  Pt will report with < 2/10 pain R groin pain with walking > 10 steps, and without using hand placement with sneezing and coughing in order to progress to walking to longer distances and coughing/ sneezing with better intraabdominal pressure.      Time  4    Period  Weeks    Status  New            Plan - 03/18/18 1626    Clinical Impression Statement  Pt demo'd improve pelvic floor coodination without overuse of abdominal mm and signficantly less perineal scar restrictions. Progressed pt to upright co-activation of deep core/ lower kinetic chain exercises. Pt demo'd improve gait mecahnics and reported less prolapse Sx. Pt continues to benefit from skilled PT.      Rehab Potential  Good    PT Frequency  2x / week    PT Duration  12 weeks    PT Treatment/Interventions  Therapeutic exercise;Neuromuscular re-education;Scar mobilization;Manual techniques;Stair training;Patient/family education;Therapeutic activities;Moist Heat;Passive range of motion;Canalith Repostioning    Consulted and Agree with Plan of Care  Patient       Patient will benefit from skilled therapeutic intervention in order to improve the following deficits and impairments:  Abnormal gait, Improper body mechanics, Pain, Postural dysfunction, Increased muscle spasms, Decreased scar mobility, Decreased coordination, Decreased mobility, Decreased endurance, Decreased range of motion, Decreased strength, Impaired perceived functional ability, Impaired UE functional use, Decreased safety awareness, Difficulty walking, Decreased balance, Decreased activity tolerance, Impaired flexibility, Impaired vision/preception, Hypomobility, Increased fascial restricitons  Visit Diagnosis: Sacrococcygeal disorders, not elsewhere  classified  Other abnormalities of gait and mobility  Other lack of coordination     Problem List There are no active problems to display for this patient.   Jerl Mina ,PT, DPT, E-RYT  03/18/2018, 4:31 PM  [place] MAIN [facility] SERVICES [address] [place], [place], [address] Phone: [phone]   Fax:  [phone]  Name: Susan Hickman MRN: [id number] Date of Birth: 07-13-1955

## 2018-04-04 ENCOUNTER — Ambulatory Visit: Payer: BLUE CROSS/BLUE SHIELD | Admitting: Physical Therapy

## 2018-04-30 ENCOUNTER — Ambulatory Visit: Payer: BLUE CROSS/BLUE SHIELD | Admitting: Physical Therapy

## 2019-05-12 ENCOUNTER — Other Ambulatory Visit: Payer: Self-pay | Admitting: Internal Medicine

## 2019-05-12 DIAGNOSIS — Z1231 Encounter for screening mammogram for malignant neoplasm of breast: Secondary | ICD-10-CM

## 2019-05-14 ENCOUNTER — Other Ambulatory Visit: Payer: Self-pay | Admitting: Internal Medicine

## 2019-05-14 DIAGNOSIS — R7989 Other specified abnormal findings of blood chemistry: Secondary | ICD-10-CM

## 2019-05-14 DIAGNOSIS — R945 Abnormal results of liver function studies: Secondary | ICD-10-CM

## 2019-05-20 ENCOUNTER — Other Ambulatory Visit: Payer: Self-pay

## 2019-05-20 ENCOUNTER — Ambulatory Visit
Admission: RE | Admit: 2019-05-20 | Discharge: 2019-05-20 | Disposition: A | Discharge status: Home / Self Care | Payer: 59 | Source: Ambulatory Visit | Attending: Internal Medicine | Admitting: Internal Medicine

## 2019-05-20 DIAGNOSIS — R945 Abnormal results of liver function studies: Secondary | ICD-10-CM | POA: Insufficient documentation

## 2019-05-20 DIAGNOSIS — R7989 Other specified abnormal findings of blood chemistry: Secondary | ICD-10-CM

## 2019-06-04 ENCOUNTER — Encounter: Payer: Self-pay | Admitting: Radiology

## 2019-06-04 ENCOUNTER — Ambulatory Visit
Admission: RE | Admit: 2019-06-04 | Discharge: 2019-06-04 | Disposition: A | Discharge status: Home / Self Care | Payer: 59 | Source: Ambulatory Visit | Attending: Internal Medicine | Admitting: Internal Medicine

## 2019-06-04 DIAGNOSIS — Z1231 Encounter for screening mammogram for malignant neoplasm of breast: Secondary | ICD-10-CM | POA: Diagnosis not present

## 2019-06-09 ENCOUNTER — Inpatient Hospital Stay
Admission: RE | Admit: 2019-06-09 | Discharge: 2019-06-09 | Disposition: A | Discharge status: Home / Self Care | Payer: Self-pay | Source: Ambulatory Visit | Attending: *Deleted | Admitting: *Deleted

## 2019-06-09 ENCOUNTER — Other Ambulatory Visit
Admission: RE | Admit: 2019-06-09 | Discharge: 2019-06-09 | Disposition: A | Discharge status: Home / Self Care | Payer: 59 | Source: Ambulatory Visit | Attending: Internal Medicine | Admitting: Internal Medicine

## 2019-06-09 ENCOUNTER — Other Ambulatory Visit: Payer: Self-pay | Admitting: *Deleted

## 2019-06-09 DIAGNOSIS — Z1231 Encounter for screening mammogram for malignant neoplasm of breast: Secondary | ICD-10-CM

## 2019-06-09 DIAGNOSIS — R42 Dizziness and giddiness: Secondary | ICD-10-CM | POA: Insufficient documentation

## 2019-06-09 DIAGNOSIS — R0602 Shortness of breath: Secondary | ICD-10-CM | POA: Insufficient documentation

## 2019-06-09 LAB — FIBRIN DERIVATIVES D-DIMER (ARMC ONLY): Fibrin derivatives D-dimer (ARMC): 378.34 ng/mL (FEU) (ref 0.00–499.00)

## 2019-06-10 LAB — TROPONIN I (HIGH SENSITIVITY): Troponin I (High Sensitivity): 3 ng/L (ref <18)

## 2019-06-23 ENCOUNTER — Other Ambulatory Visit: Payer: Self-pay | Admitting: Internal Medicine

## 2019-06-23 DIAGNOSIS — R7989 Other specified abnormal findings of blood chemistry: Secondary | ICD-10-CM

## 2019-06-23 DIAGNOSIS — I471 Supraventricular tachycardia: Secondary | ICD-10-CM

## 2019-06-23 DIAGNOSIS — R42 Dizziness and giddiness: Secondary | ICD-10-CM

## 2019-06-23 DIAGNOSIS — R270 Ataxia, unspecified: Secondary | ICD-10-CM

## 2019-06-23 DIAGNOSIS — R945 Abnormal results of liver function studies: Secondary | ICD-10-CM

## 2019-06-26 ENCOUNTER — Ambulatory Visit
Admission: RE | Admit: 2019-06-26 | Discharge: 2019-06-26 | Disposition: A | Discharge status: Home / Self Care | Payer: 59 | Source: Ambulatory Visit | Attending: Internal Medicine | Admitting: Internal Medicine

## 2019-06-26 ENCOUNTER — Other Ambulatory Visit: Payer: Self-pay

## 2019-06-26 DIAGNOSIS — R7989 Other specified abnormal findings of blood chemistry: Secondary | ICD-10-CM

## 2019-06-26 DIAGNOSIS — I471 Supraventricular tachycardia, unspecified: Secondary | ICD-10-CM

## 2019-06-26 DIAGNOSIS — R945 Abnormal results of liver function studies: Secondary | ICD-10-CM | POA: Insufficient documentation

## 2019-06-26 DIAGNOSIS — R42 Dizziness and giddiness: Secondary | ICD-10-CM | POA: Diagnosis present

## 2019-06-26 DIAGNOSIS — R270 Ataxia, unspecified: Secondary | ICD-10-CM

## 2019-06-30 ENCOUNTER — Other Ambulatory Visit: Payer: Self-pay | Admitting: Gastroenterology

## 2019-06-30 DIAGNOSIS — R7989 Other specified abnormal findings of blood chemistry: Secondary | ICD-10-CM

## 2019-07-04 ENCOUNTER — Other Ambulatory Visit: Payer: Self-pay | Admitting: Radiology

## 2019-07-07 ENCOUNTER — Other Ambulatory Visit: Payer: Self-pay

## 2019-07-07 ENCOUNTER — Ambulatory Visit
Admission: RE | Admit: 2019-07-07 | Discharge: 2019-07-07 | Disposition: A | Discharge status: Home / Self Care | Payer: 59 | Source: Ambulatory Visit | Attending: Gastroenterology | Admitting: Gastroenterology

## 2019-07-07 DIAGNOSIS — R110 Nausea: Secondary | ICD-10-CM | POA: Insufficient documentation

## 2019-07-07 DIAGNOSIS — R7989 Other specified abnormal findings of blood chemistry: Secondary | ICD-10-CM | POA: Diagnosis not present

## 2019-07-07 DIAGNOSIS — Z79899 Other long term (current) drug therapy: Secondary | ICD-10-CM | POA: Diagnosis not present

## 2019-07-07 DIAGNOSIS — M810 Age-related osteoporosis without current pathological fracture: Secondary | ICD-10-CM | POA: Diagnosis not present

## 2019-07-07 DIAGNOSIS — R1011 Right upper quadrant pain: Secondary | ICD-10-CM | POA: Insufficient documentation

## 2019-07-07 HISTORY — DX: Herpesviral infection, unspecified: B00.9

## 2019-07-07 LAB — CBC
HCT: 45.5 % (ref 36.0–46.0)
Hemoglobin: 15 g/dL (ref 12.0–15.0)
MCH: 30.5 pg (ref 26.0–34.0)
MCHC: 33 g/dL (ref 30.0–36.0)
MCV: 92.5 fL (ref 80.0–100.0)
Platelets: 178 10*3/uL (ref 150–400)
RBC: 4.92 MIL/uL (ref 3.87–5.11)
RDW: 14.7 % (ref 11.5–15.5)
WBC: 5.2 10*3/uL (ref 4.0–10.5)
nRBC: 0 % (ref 0.0–0.2)

## 2019-07-07 LAB — PROTIME-INR
INR: 1 (ref 0.8–1.2)
Prothrombin Time: 13.4 seconds (ref 11.4–15.2)

## 2019-07-07 LAB — APTT: aPTT: 30 seconds (ref 24–36)

## 2019-07-07 MED ORDER — FENTANYL CITRATE (PF) 100 MCG/2ML IJ SOLN
INTRAMUSCULAR | Status: AC
  Filled 2019-07-07: qty 2

## 2019-07-07 MED ORDER — MIDAZOLAM HCL 5 MG/5ML IJ SOLN
INTRAMUSCULAR | Status: AC
  Filled 2019-07-07: qty 5

## 2019-07-07 MED ORDER — SODIUM CHLORIDE 0.9 % IV SOLN: INTRAVENOUS | Status: DC

## 2019-07-07 MED ORDER — FENTANYL CITRATE (PF) 100 MCG/2ML IJ SOLN
INTRAMUSCULAR | Status: AC | PRN
  Administered 2019-07-07: 50 ug via INTRAVENOUS

## 2019-07-07 MED ORDER — MIDAZOLAM HCL 5 MG/5ML IJ SOLN
INTRAMUSCULAR | Status: AC | PRN
  Administered 2019-07-07: 1 mg via INTRAVENOUS

## 2019-07-07 NOTE — H&P (Signed)
Chief Complaint: Susan Hickman was seen in consultation today for an image guided random liver biopsy.  Referring Physician(s): Geanie Kenning, PA-C ([place])  Supervising Physician: Markus Daft  Susan Hickman Status: [facility] - Out-pt  History of Present Illness: Susan Hickman is a 64 y.o. female with a past medical history significant for osteoporosis, migraines, IBD, GERD and elevated LFTs of unknown etiology followed by GI who presents today for a random liver biopsy. Susan Hickman was first seen by GI on 06/17/19 due to worsening LFT elevation and normal RUQ [place] (05/20/19), Susan Hickman also reported several concerning symptoms at that visit including chills w/o fever, night sweats and confusion. Susan Hickman underwent further laboratory workup after this visit which showed further worsening of LFTs but no other acute concerns. Susan Hickman has been referred to IR for an image guided random liver biopsy for further evaluation.  Susan Hickman states that Susan Hickman has had persistent daily nausea without vomiting that Susan Hickman feels worsens as her liver enzymes increase. Susan Hickman has been unable to tolerate her typical food intake - now tolerating about 1/4-1/2 of her regular meals due to early satiety. Susan Hickman also recently began to experience RUQ aching pain/fullness in the last week or so. Susan Hickman has not noticed any changes in her bowel habits, skin color changes or abnormal bleeding. Susan Hickman is concerned about post procedure bleeding because Susan Hickman had a large hematoma from a previous heart cath that was extremely painful and required several return trips to [facility] for evaluation. Susan Hickman states understanding of the requested procedure and wishes to proceed as planned.  Past Medical History:  Diagnosis Date  . GERD (gastroesophageal reflux disease)   . Irritable bowel   . Migraine headache   . Nephrolithiasis   . Osteoporosis   . Postmenopausal     Past Surgical History:  Procedure Laterality Date  . ABDOMINAL HYSTERECTOMY     B ovaries intact  .  BREAST BIOPSY Left 2000   benign w/ clip placement @ [facility]  . BREAST CYST ASPIRATION Right   . BREAST CYST EXCISION Right 1980  . CYSTECTOMY Right 2003   R ovary     Allergies: Codeine, Shellfish allergy, and [place]win [pentazocine]  Medications: Prior to Admission medications   Medication Sig Start Date End Date Taking? Authorizing Provider  metoprolol tartrate (LOPRESSOR) 25 MG tablet Take 12.5 mg by mouth 2 (two) times daily.    [provider]  Multiple Vitamins-Minerals (MULTIVITAMINS) CHEW Chew by mouth daily.    [provider]  sertraline (ZOLOFT) 25 MG tablet Take 25 mg by mouth daily.    [provider]  sulfamethoxazole-trimethoprim (BACTRIM DS) 800-160 MG per tablet Take 1 tablet by mouth 2 (two) times daily. As directed.    [provider]     Family History  Problem Relation Age of Onset  . Heart disease Mother   . Breast cancer Neg Hx     Social History   Socioeconomic History  . Marital status: Married    Spouse name: Not on file  . Number of children: Not on file  . Years of education: Not on file  . Highest education level: Not on file  Occupational History  . Not on file  Tobacco Use  . Smoking status: Never Smoker  . Smokeless tobacco: Never Used  Substance and Sexual Activity  . Alcohol use: Not Currently  . Drug use: Not Currently  . Sexual activity: Not Currently  Other Topics Concern  . Not on file  Social History  Narrative  . Not on file   Social Determinants of Health   Financial Resource Strain:   . Difficulty of Paying Living Expenses: Not on file  Food Insecurity:   . Worried About Charity fundraiser in the Last Year: Not on file  . Ran Out of Food in the Last Year: Not on file  Transportation Needs:   . Lack of Transportation (Medical): Not on file  . Lack of Transportation (Non-Medical): Not on file  Physical Activity:   . Days of Exercise per Week: Not on file  . Minutes of Exercise per  Session: Not on file  Stress:   . Feeling of Stress : Not on file  Social Connections:   . Frequency of Communication with Friends and Family: Not on file  . Frequency of Social Gatherings with Friends and Family: Not on file  . Attends Religious Services: Not on file  . Active Member of Clubs or Organizations: Not on file  . Attends Archivist Meetings: Not on file  . Marital Status: Not on file     Review of Systems: A 12 point ROS discussed and pertinent positives are indicated in the HPI above.  All other systems are negative.  Review of Systems  Constitutional: Positive for appetite change. Negative for chills, fatigue and fever.  Respiratory: Negative for cough and shortness of breath.   Cardiovascular: Negative for chest pain.  Gastrointestinal: Positive for abdominal pain (RUQ - aching, fullness) and nausea. Negative for blood in stool, diarrhea and vomiting.  Genitourinary: Negative for hematuria.  Musculoskeletal: Negative for back pain.  Skin: Negative for color change.  Neurological: Negative for dizziness and headaches.    Vital Signs: BP (!) 105/57   Pulse 60   Temp 98.3 F (36.8 C) (Oral)   Resp 12   Ht 5\' 1"  (1.549 m)   Wt 136 lb (61.7 kg)   SpO2 98%   BMI 25.70 kg/m   Physical Exam Vitals reviewed.  Constitutional:      General: Susan Hickman is not in acute distress. HENT:     Head: Normocephalic.     Mouth/Throat:     Mouth: Mucous membranes are moist.     Pharynx: Oropharynx is clear. No oropharyngeal exudate or posterior oropharyngeal erythema.  Eyes:     General: No scleral icterus. Cardiovascular:     Rate and Rhythm: Normal rate and regular rhythm.  Pulmonary:     Effort: Pulmonary effort is normal.     Breath sounds: Normal breath sounds.  Abdominal:     General: There is no distension.     Palpations: Abdomen is soft.     Tenderness: There is no abdominal tenderness.  Skin:    General: Skin is warm and dry.     Coloration: Skin is  not jaundiced.  Neurological:     Mental Status: Susan Hickman is alert and oriented to person, place, and time.  Psychiatric:        Mood and Affect: Mood normal.        Behavior: Behavior normal.        Thought Content: Thought content normal.        Judgment: Judgment normal.      MD Evaluation Airway: WNL Heart: WNL Abdomen: WNL Chest/ Lungs: WNL ASA  Classification: 2 Mallampati/Airway Score: One   Imaging: CT HEAD WO CONTRAST  Result Date: 06/26/2019 CLINICAL DATA:  Ataxia. Abnormal LFTs. Supraventricular tachycardia. Dizzy. Additional history provided by scanning technologist: Susan Hickman reports confusion, dizziness,  lightheadedness, nausea intermittently for 3 months. EXAM: CT HEAD WITHOUT CONTRAST TECHNIQUE: Contiguous axial images were obtained from the base of the skull through the vertex without intravenous contrast. COMPARISON:  No pertinent prior studies available for comparison. FINDINGS: Brain: There is no evidence of acute intracranial hemorrhage, intracranial mass, midline shift or extra-axial fluid collection. No demarcated cortical infarction. Mild generalized parenchymal atrophy. Vascular: No hyperdense vessel. Skull: Normal. Negative for fracture or focal lesion. Sinuses/Orbits: Visualized orbits demonstrate no acute abnormality. No significant paranasal sinus disease or mastoid effusion at the imaged levels. IMPRESSION: No evidence of acute intracranial abnormality. Mild generalized parenchymal atrophy. Electronically Signed   By: Kellie Simmering DO   On: 06/26/2019 18:50    Labs:  CBC: No results for input(s): WBC, HGB, HCT, PLT in the last 8760 hours.  COAGS: No results for input(s): INR, APTT in the last 8760 hours.  BMP: No results for input(s): NA, K, CL, CO2, GLUCOSE, BUN, CALCIUM, CREATININE, GFRNONAA, GFRAA in the last 8760 hours.  Invalid input(s): CMP  LIVER FUNCTION TESTS: No results for input(s): BILITOT, AST, ALT, ALKPHOS, PROT, ALBUMIN in the last 8760  hours.  TUMOR MARKERS: No results for input(s): AFPTM, CEA, CA199, CHROMGRNA in the last 8760 hours.  Assessment and Plan:  64 y/o F with worsening LFTs of unknown etiology followed by GI who presents today for a random liver biopsy.  Susan Hickman has been NPO since 8 pm last night, Susan Hickman does not take blood thinning medications. Afebrile, WBC 5.2, hgb 15.0, plt 178, INR pending at time of this note writing and will be reviewed prior to procedure. Most recent LFTs (06/30/19) - AST 234, ALT 474, ALP 56, t.bili 0.6. HCV (-) on 05/07/19.  Risks and benefits of random liver biopsy was discussed with the Susan Hickman and/or Susan Hickman's family including, but not limited to bleeding, infection, damage to adjacent structures or low yield requiring additional tests.  All of the questions were answered and there is agreement to proceed.  Consent signed and in chart.   Thank you for this interesting consult.  I greatly enjoyed meeting Susan Hickman and look forward to participating in their care.  A copy of this report was sent to the requesting provider on this date.  Electronically Signed: Joaquim Nam, PA-C 07/07/2019, 10:02 AM   I spent a total of30 Minutes  in face to face in clinical consultation, greater than 50% of which was counseling/coordinating care for a random liver biopsy.

## 2019-07-07 NOTE — Procedures (Signed)
Interventional Radiology Procedure:   Indications: Elevated LFTs  Procedure: US guided random liver biopsy  Findings: 3 cores from right hepatic lobe  Complications: None     EBL: less than 10 ml  Plan: 3 hours bedrest.   Susan Hickman R. Anselm Pancoast, MD  Pager: [phone]

## 2019-07-07 NOTE — Discharge Instructions (Signed)
Liver Biopsy, Care After These instructions give you information about how to care for yourself after your procedure. Your health care provider may also give you more specific instructions. If you have problems or questions, contact your health care provider. What can I expect after the procedure? After your procedure, it is common to have:  Pain and soreness in the area where the biopsy was done.  Bruising around the area where the biopsy was done.  Sleepiness and fatigue for 1-2 days. Follow these instructions at home: Medicines  Take over-the-counter and prescription medicines only as told by your health care provider.  If you were prescribed an antibiotic medicine, take it as told by your health care provider. Do not stop taking the antibiotic even if you start to feel better.  Do not take medicines such as aspirin and ibuprofen unless your health care provider tells you to take them. These medicines thin your blood and can increase the risk of bleeding.  If you are taking prescription pain medicine, take actions to prevent or treat constipation. Your health care provider may recommend that you: ? Drink enough fluid to keep your urine pale yellow. ? Eat foods that are high in fiber, such as fresh fruits and vegetables, whole grains, and beans. ? Limit foods that are high in fat and processed sugars, such as fried or sweet foods. ? Take an over-the-counter or prescription medicine for constipation. Incision care ? Wash your hands with soap and water before you change your bandage (dressing). If soap and water are not available, use hand sanitizer. ? Change your bandage tomorrow after you shower and then remove the next day.  Check your incision area every day for signs of infection. Check for: ? Redness, swelling, or pain. ? Fluid or blood. ? Warmth. ? Pus or a bad smell. You may shower tomorrow  No lifting more than 5 lbs for 3 days  Return to your normal activities as told  by your health care provider.   Do not drive or use heavy machinery for 24hrs or while taking prescription pain medicine.  Do not play contact sports for 2 weeks after the procedure. General instructions   Do not drink alcohol in the first week after the procedure.  Have someone stay with you for at least 24 hours after the procedure.  It is your responsibility to obtain your test results. Ask your health care provider, or the department that is doing the test: ? When will my results be ready? ? How will I get my results? ? What are my treatment options? ? What other tests do I need? ? What are my next steps?  Keep all follow-up visits as told by your health care provider. This is important. Contact a health care provider if:  You have increased bleeding from an incision, resulting in more than a small spot of blood.  You have redness, swelling, or increasing pain in any incisions.  You notice a discharge or a bad smell coming from any of your incisions.  You have a fever or chills. Get help right away if:  You develop swelling, bloating, or pain in your abdomen.  You become dizzy or faint.  You develop a rash.  You have nausea or you vomit.  You faint, or you have shortness of breath or difficulty breathing.  You develop chest pain.  You have problems with your speech or vision.  You have trouble with your balance or moving your arms or legs. Summary    After the liver biopsy, it is common to have pain, soreness, and bruising in the area, as well as sleepiness and fatigue.  Take over-the-counter and prescription medicines only as told by your health care provider.  Follow instructions from your health care provider about how to care for your incision. Check the incision area daily for signs of infection. This information is not intended to replace advice given to you by your health care provider. Make sure you discuss any questions you have with your health care  provider. Document Released: 11/04/2004 Document Revised: 06/10/2018 Document Reviewed: 04/27/2017 Elsevier Patient Education  2020 [employer] 

## 2019-07-11 LAB — SURGICAL PATHOLOGY

## 2019-07-18 ENCOUNTER — Other Ambulatory Visit: Payer: Self-pay | Admitting: Gastroenterology

## 2019-07-18 DIAGNOSIS — R7401 Elevation of levels of liver transaminase levels: Secondary | ICD-10-CM

## 2019-07-22 ENCOUNTER — Other Ambulatory Visit: Payer: Self-pay

## 2019-07-22 ENCOUNTER — Ambulatory Visit
Admission: RE | Admit: 2019-07-22 | Discharge: 2019-07-22 | Disposition: A | Discharge status: Home / Self Care | Payer: 59 | Source: Ambulatory Visit | Attending: Gastroenterology | Admitting: Gastroenterology

## 2019-07-22 DIAGNOSIS — R7401 Elevation of levels of liver transaminase levels: Secondary | ICD-10-CM

## 2020-02-27 ENCOUNTER — Other Ambulatory Visit (HOSPITAL_COMMUNITY): Payer: Self-pay | Admitting: Nurse Practitioner

## 2020-02-27 ENCOUNTER — Other Ambulatory Visit: Payer: Self-pay | Admitting: Nurse Practitioner

## 2020-02-27 DIAGNOSIS — M5414 Radiculopathy, thoracic region: Secondary | ICD-10-CM

## 2020-03-11 ENCOUNTER — Ambulatory Visit
Admission: RE | Admit: 2020-03-11 | Discharge: 2020-03-11 | Disposition: A | Discharge status: Home / Self Care | Payer: 59 | Source: Ambulatory Visit | Attending: Nurse Practitioner | Admitting: Nurse Practitioner

## 2020-03-11 ENCOUNTER — Other Ambulatory Visit: Payer: Self-pay

## 2020-03-11 DIAGNOSIS — M5414 Radiculopathy, thoracic region: Secondary | ICD-10-CM | POA: Insufficient documentation

## 2020-03-19 ENCOUNTER — Other Ambulatory Visit: Payer: Self-pay | Admitting: Orthopedic Surgery

## 2020-03-22 ENCOUNTER — Other Ambulatory Visit
Admission: RE | Admit: 2020-03-22 | Discharge: 2020-03-22 | Disposition: A | Discharge status: Home / Self Care | Payer: 59 | Source: Ambulatory Visit | Attending: Orthopedic Surgery | Admitting: Orthopedic Surgery

## 2020-03-22 ENCOUNTER — Other Ambulatory Visit: Payer: Self-pay

## 2020-03-22 DIAGNOSIS — Z01812 Encounter for preprocedural laboratory examination: Secondary | ICD-10-CM | POA: Diagnosis present

## 2020-03-22 DIAGNOSIS — Z20822 Contact with and (suspected) exposure to covid-19: Secondary | ICD-10-CM | POA: Diagnosis not present

## 2020-03-22 LAB — SARS CORONAVIRUS 2 (TAT 6-24 HRS): SARS Coronavirus 2: NEGATIVE

## 2020-03-23 ENCOUNTER — Ambulatory Visit
Admission: RE | Admit: 2020-03-23 | Discharge: 2020-03-23 | Disposition: A | Discharge status: Home / Self Care | Payer: 59 | Attending: Orthopedic Surgery | Admitting: Orthopedic Surgery

## 2020-03-23 ENCOUNTER — Ambulatory Visit: Payer: 59 | Admitting: Anesthesiology

## 2020-03-23 ENCOUNTER — Encounter
Admission: RE | Disposition: A | Discharge status: Home / Self Care | Payer: Self-pay | Source: Home / Self Care | Attending: Orthopedic Surgery

## 2020-03-23 ENCOUNTER — Other Ambulatory Visit: Payer: Self-pay

## 2020-03-23 ENCOUNTER — Encounter: Payer: Self-pay | Admitting: Orthopedic Surgery

## 2020-03-23 ENCOUNTER — Ambulatory Visit: Payer: 59

## 2020-03-23 DIAGNOSIS — Z885 Allergy status to narcotic agent status: Secondary | ICD-10-CM | POA: Diagnosis not present

## 2020-03-23 DIAGNOSIS — Z888 Allergy status to other drugs, medicaments and biological substances status: Secondary | ICD-10-CM | POA: Diagnosis not present

## 2020-03-23 DIAGNOSIS — M4854XA Collapsed vertebra, not elsewhere classified, thoracic region, initial encounter for fracture: Secondary | ICD-10-CM | POA: Diagnosis present

## 2020-03-23 DIAGNOSIS — Z79899 Other long term (current) drug therapy: Secondary | ICD-10-CM | POA: Diagnosis not present

## 2020-03-23 DIAGNOSIS — Z419 Encounter for procedure for purposes other than remedying health state, unspecified: Secondary | ICD-10-CM

## 2020-03-23 HISTORY — PX: KYPHOPLASTY: SHX5884

## 2020-03-23 SURGERY — KYPHOPLASTY
Anesthesia: General

## 2020-03-23 MED ORDER — SODIUM CHLORIDE 0.9 % IV SOLN: INTRAVENOUS | Status: DC

## 2020-03-23 MED ORDER — ONDANSETRON HCL 4 MG PO TABS: 4.0000 mg | ORAL_TABLET | Freq: Four times a day (QID) | ORAL | Status: DC | PRN

## 2020-03-23 MED ORDER — PROPOFOL 500 MG/50ML IV EMUL
INTRAVENOUS | Status: AC
  Filled 2020-03-23: qty 50

## 2020-03-23 MED ORDER — CEFAZOLIN SODIUM-DEXTROSE 1-4 GM/50ML-% IV SOLN
INTRAVENOUS | Status: AC
  Filled 2020-03-23: qty 50

## 2020-03-23 MED ORDER — BUPIVACAINE-EPINEPHRINE (PF) 0.5% -1:200000 IJ SOLN
INTRAMUSCULAR | Status: AC
  Filled 2020-03-23: qty 60

## 2020-03-23 MED ORDER — FENTANYL CITRATE (PF) 100 MCG/2ML IJ SOLN
INTRAMUSCULAR | Status: AC
  Filled 2020-03-23: qty 2

## 2020-03-23 MED ORDER — FENTANYL CITRATE (PF) 100 MCG/2ML IJ SOLN
INTRAMUSCULAR | Status: DC | PRN
  Administered 2020-03-23 (×2): 50 ug via INTRAVENOUS

## 2020-03-23 MED ORDER — LIDOCAINE HCL (PF) 1 % IJ SOLN
INTRAMUSCULAR | Status: AC
  Filled 2020-03-23: qty 60

## 2020-03-23 MED ORDER — METOCLOPRAMIDE HCL 5 MG/ML IJ SOLN: 5.0000 mg | Freq: Three times a day (TID) | INTRAMUSCULAR | Status: DC | PRN

## 2020-03-23 MED ORDER — MIDAZOLAM HCL 2 MG/2ML IJ SOLN
INTRAMUSCULAR | Status: AC
  Filled 2020-03-23: qty 2

## 2020-03-23 MED ORDER — BUPIVACAINE-EPINEPHRINE (PF) 0.5% -1:200000 IJ SOLN
INTRAMUSCULAR | Status: DC | PRN
  Administered 2020-03-23: 20 mL

## 2020-03-23 MED ORDER — PROPOFOL 500 MG/50ML IV EMUL
INTRAVENOUS | Status: DC | PRN
  Administered 2020-03-23: 50 ug/kg/min via INTRAVENOUS

## 2020-03-23 MED ORDER — CHLORHEXIDINE GLUCONATE 0.12 % MT SOLN: 15.0000 mL | Freq: Once | OROMUCOSAL | Status: AC

## 2020-03-23 MED ORDER — ORAL CARE MOUTH RINSE: 15.0000 mL | Freq: Once | OROMUCOSAL | Status: AC

## 2020-03-23 MED ORDER — ONDANSETRON HCL 4 MG/2ML IJ SOLN
4.0000 mg | Freq: Once | INTRAMUSCULAR | Status: AC | PRN
  Administered 2020-03-23: 4 mg via INTRAVENOUS

## 2020-03-23 MED ORDER — CEFAZOLIN SODIUM-DEXTROSE 1-4 GM/50ML-% IV SOLN
1.0000 g | INTRAVENOUS | Status: AC
  Administered 2020-03-23: 1 g via INTRAVENOUS

## 2020-03-23 MED ORDER — FENTANYL CITRATE (PF) 100 MCG/2ML IJ SOLN
INTRAMUSCULAR | Status: AC
  Administered 2020-03-23: 25 ug via INTRAVENOUS
  Filled 2020-03-23: qty 2

## 2020-03-23 MED ORDER — ONDANSETRON HCL 4 MG/2ML IJ SOLN: 4.0000 mg | Freq: Four times a day (QID) | INTRAMUSCULAR | Status: DC | PRN

## 2020-03-23 MED ORDER — IOHEXOL 180 MG/ML  SOLN
INTRAMUSCULAR | Status: DC | PRN
  Administered 2020-03-23: 40 mL

## 2020-03-23 MED ORDER — METOCLOPRAMIDE HCL 10 MG PO TABS: 5.0000 mg | ORAL_TABLET | Freq: Three times a day (TID) | ORAL | Status: DC | PRN

## 2020-03-23 MED ORDER — MIDAZOLAM HCL 2 MG/2ML IJ SOLN
INTRAMUSCULAR | Status: DC | PRN
  Administered 2020-03-23 (×2): 1 mg via INTRAVENOUS

## 2020-03-23 MED ORDER — PROPOFOL 10 MG/ML IV BOLUS
INTRAVENOUS | Status: DC | PRN
  Administered 2020-03-23 (×2): 25 mg via INTRAVENOUS

## 2020-03-23 MED ORDER — ONDANSETRON HCL 4 MG/2ML IJ SOLN
INTRAMUSCULAR | Status: AC
  Filled 2020-03-23: qty 2

## 2020-03-23 MED ORDER — CHLORHEXIDINE GLUCONATE 0.12 % MT SOLN
OROMUCOSAL | Status: AC
  Administered 2020-03-23: 15 mL via OROMUCOSAL
  Filled 2020-03-23: qty 15

## 2020-03-23 MED ORDER — LACTATED RINGERS IV SOLN: INTRAVENOUS | Status: DC

## 2020-03-23 MED ORDER — FENTANYL CITRATE (PF) 100 MCG/2ML IJ SOLN
25.0000 ug | INTRAMUSCULAR | Status: DC | PRN
  Administered 2020-03-23 (×3): 25 ug via INTRAVENOUS

## 2020-03-23 MED ORDER — LIDOCAINE HCL 1 % IJ SOLN
INTRAMUSCULAR | Status: DC | PRN
  Administered 2020-03-23: 30 mL

## 2020-03-23 SURGICAL SUPPLY — 23 items
ADH SKN CLS APL DERMABOND .7 (GAUZE/BANDAGES/DRESSINGS) ×1
CEMENT KYPHON CX01A KIT/MIXER (Cement) ×3 IMPLANT
COVER WAND RF STERILE (DRAPES) ×3 IMPLANT
DERMABOND ADVANCED (GAUZE/BANDAGES/DRESSINGS) ×2
DERMABOND ADVANCED .7 DNX12 (GAUZE/BANDAGES/DRESSINGS) ×1 IMPLANT
DEVICE BIOPSY BONE KYPHX (INSTRUMENTS) ×3 IMPLANT
DRAPE C-ARM XRAY 36X54 (DRAPES) ×3 IMPLANT
DURAPREP 26ML APPLICATOR (WOUND CARE) ×3 IMPLANT
GLOVE SURG SYN 9.0  PF PI (GLOVE) ×2
GLOVE SURG SYN 9.0 PF PI (GLOVE) ×1 IMPLANT
GOWN SRG 2XL LVL 4 RGLN SLV (GOWNS) ×1 IMPLANT
GOWN STRL NON-REIN 2XL LVL4 (GOWNS) ×3
GOWN STRL REUS W/ TWL LRG LVL3 (GOWN DISPOSABLE) ×1 IMPLANT
GOWN STRL REUS W/TWL LRG LVL3 (GOWN DISPOSABLE) ×3
MANIFOLD NEPTUNE II (INSTRUMENTS) ×3 IMPLANT
PACK KYPHOPLASTY (MISCELLANEOUS) ×3 IMPLANT
RENTAL RFA  GENERATOR (MISCELLANEOUS)
RENTAL RFA GENERATOR (MISCELLANEOUS) IMPLANT
STRAP SAFETY 5IN WIDE (MISCELLANEOUS) ×3 IMPLANT
SWABSTK COMLB BENZOIN TINCTURE (MISCELLANEOUS) ×3 IMPLANT
TRAY KYPHOPAK 15/2 EXPRESS (KITS) ×3 IMPLANT
TRAY KYPHOPAK 15/3 EXPRESS 1ST (MISCELLANEOUS) IMPLANT
TRAY KYPHOPAK 20/3 EXPRESS 1ST (MISCELLANEOUS) IMPLANT

## 2020-03-23 NOTE — Anesthesia Procedure Notes (Signed)
Date/Time: 03/23/2020 10:02 AM Performed by: Nelda Marseille, CRNA Pre-anesthesia Checklist: Patient identified, Emergency Drugs available, Suction available, Patient being monitored and Timeout performed Oxygen Delivery Method: Nasal cannula

## 2020-03-23 NOTE — Anesthesia Preprocedure Evaluation (Addendum)
Anesthesia Evaluation  Patient identified by MRN, date of birth, ID band Patient awake    Reviewed: Allergy & Precautions, NPO status , Patient's Chart, lab work & pertinent test results  History of Anesthesia Complications (+) PONV and history of anesthetic complications  Airway Mallampati: II       Dental   Pulmonary neg sleep apnea, neg COPD, Not current smoker,           Cardiovascular hypertension, Pt. on medications (-) Past MI and (-) CHF + dysrhythmias Supra Ventricular Tachycardia (-) Valvular Problems/Murmurs     Neuro/Psych neg Seizures    GI/Hepatic GERD  Medicated and Controlled,(+) Hepatitis -, Toxin Related  Endo/Other  neg diabetes  Renal/GU Renal disease (stones)     Musculoskeletal   Abdominal   Peds  Hematology   Anesthesia Other Findings   Reproductive/Obstetrics                            Anesthesia Physical Anesthesia Plan  ASA: III  Anesthesia Plan: General   Post-op Pain Management:    Induction:   PONV Risk Score and Plan: 3 and Propofol infusion and TIVA  Airway Management Planned: Nasal Cannula  Additional Equipment:   Intra-op Plan:   Post-operative Plan:   Informed Consent: I have reviewed the patients History and Physical, chart, labs and discussed the procedure including the risks, benefits and alternatives for the proposed anesthesia with the patient or authorized representative who has indicated his/her understanding and acceptance.       Plan Discussed with:   Anesthesia Plan Comments:         Anesthesia Quick Evaluation

## 2020-03-23 NOTE — Anesthesia Postprocedure Evaluation (Signed)
Anesthesia Post Note  Patient: Susan Hickman  Procedure(s) Performed: KYPHOPLASTY (N/A )  Patient location during evaluation: PACU Anesthesia Type: General Level of consciousness: awake and alert Pain management: pain level controlled Vital Signs Assessment: post-procedure vital signs reviewed and stable Respiratory status: spontaneous breathing and respiratory function stable Cardiovascular status: stable Anesthetic complications: no   No complications documented.   Last Vitals:  Vitals:   03/23/20 1113 03/23/20 1123  BP: 105/61 (!) 112/51  Pulse: 62 66  Resp: 16 16  Temp: 36.4 C 36.6 C  SpO2: 98% 98%    Last Pain:  Vitals:   03/23/20 1123  TempSrc: Temporal  PainSc: 0-No pain                 Malachi Suderman K

## 2020-03-23 NOTE — Op Note (Signed)
03/23/2020  10:34 AM  PATIENT:  Susan Hickman   MRN: [id number]   PRE-OPERATIVE DIAGNOSIS:  closed wedge compression fracture of T8   POST-OPERATIVE DIAGNOSIS:  closed wedge compression fracture of T8   PROCEDURE:  Procedure(s): KYPHOPLASTY T8  SURGEON: Laurene Footman, MD   ASSISTANTS: None   ANESTHESIA:   local and MAC   EBL:  No intake/output data recorded.   BLOOD ADMINISTERED:none   DRAINS: none    LOCAL MEDICATIONS USED:  MARCAINE    and XYLOCAINE    SPECIMEN:   T8 vertebral body biopsy   DISPOSITION OF SPECIMEN:  Pathology   COUNTS:  YES   TOURNIQUET:  * No tourniquets in log *   IMPLANTS: Bone cement   DICTATION: .Dragon Dictation  patient was brought to the operating room and after adequate anesthesia was obtained the patient was placed prone.  C arm was brought in in good visualization of the affected level obtained on both AP and lateral projections.  After patient identification and timeout procedures were completed, local anesthetic was infiltrated with 10 cc 1% Xylocaine infiltrated subcutaneously.  This is done the area on the each side of the planned approach.  The back was then prepped and draped in the usual sterile manner and repeat timeout procedure carried out.  A spinal needle was brought down to the pedicle on the each side of  T8 and a 50-50 mix of 1% Xylocaine half percent Sensorcaine with epinephrine total of 20 cc injected on each side.  After allowing this to set a small incision was made and the trocar was advanced into the vertebral body in an extrapedicular fashion.  Biopsy was obtained on both sides Drilling was carried out balloon inserted with inflation to  0.5 cc on the right and 0.5 cc on the left.  When the cement was appropriate consistency 1 cc were injected on the right and 1-1/2 cc on the left into the vertebral body with a small amount of extravasation into the T7-8 disc space, good fill superior to inferior endplates and from right to  left sides along the inferior endplate.  After the cement had set the trochar was removed and permanent C-arm views obtained.  The wound was closed with Dermabond followed by Band-Aid   PLAN OF CARE:  Discharge to home after recovery room   PATIENT DISPOSITION:  PACU - hemodynamically stable.

## 2020-03-23 NOTE — Discharge Instructions (Addendum)
Take it easy today and tomorrow. Try to avoid bending over and lifting for 2 weeks but try to walk all you can starting Thursday. Pain medicine as previously directed if tolerated and if needed. Remove Band-Aids on Thursday then okay to shower. Call office if your pain suddenly increases.   AMBULATORY SURGERY  DISCHARGE INSTRUCTIONS   1) The drugs that you were given will stay in your system until tomorrow so for the next 24 hours you should not:  A) Drive an automobile B) Make any legal decisions C) Drink any alcoholic beverage   2) You may resume regular meals tomorrow.  Today it is better to start with liquids and gradually work up to solid foods.  You may eat anything you prefer, but it is better to start with liquids, then soup and crackers, and gradually work up to solid foods.   3) Please notify your doctor immediately if you have any unusual bleeding, trouble breathing, redness and pain at the surgery site, drainage, fever, or pain not relieved by medication.    4) Additional Instructions:        Please contact your physician with any problems or Same Day Surgery at [phone], Monday through Friday 6 am to 4 pm, or [place] [facility] number at [phone].

## 2020-03-23 NOTE — H&P (Signed)
Chief Complaint  Patient presents with  . Follow-up  T8 Compression fx    History of the Present Illness: Susan Hickman is a 64 y.o. female here today for evaluation of T8 compression fracture from MRI performed at [facility]. She is referred by neurosurgery here. Her MRI was done on 03/11/2020, which shows a 30% height loss. There is a little bit of inferior T7 edema, as well, with degenerative changes at T5 through T7, acute subacute T8 with probable T7 contusion, and possible compression fracture. The patient's husband is present with her during the visit.   The patient states she started feeling pain in her back in mid 12/2019. She states she had an appointment with a rheumatologist due to autoimmune hepatitis or autoimmune disease. While she was in there, she mentioned that her back would have been bothering her for a few days, and he took an x-ray, and he noticed fluid in her lungs in the afternoon. She was called and told to see her primary care physician, Dr. Ouida Sills. She states she was told she had fluid in her lungs. She states that she had no idea because she never experienced any coughing. She states she was already on prednisone on 03/16/2020 due to autoimmune hepatitis and liver disease, and after the machines, she was put on an antibiotic. As time went on, her back pain continued to worsen. By 01/2020, it started radiating around into her ribs. She thought she had a pinched nerve, she had one years ago, and it kept getting worse. She finally saw Lonell Face, NP, who ordered an MRI, and it took a couple of weeks to get it done. She was fitted for a brace on 03/12/2020, and she did pretty good with it the first day , but on 03/13/2020, her lower back and hips started giving her a fit. She states she can hardly stand to walk with it. She states she has a bulge on her back on the right side that was almost as big as a nerve pill, but it is gone now. She states she would rub it, and it seemed to  help.  The patient states she has SVT, and she has a heart condition. She states she has not been able to take any pain medication. She states she had a heart ablation in 2016, and it only lasted for 10 months.  The patient is not on any blood thinners.  I have reviewed past medical, surgical, social and family history, and allergies as documented in the EMR.  Past Medical History: Past Medical History:  Diagnosis Date  . Allergic state  . Arrhythmia  . 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 Gastritis 11/03/13  . GERD (gastroesophageal reflux disease)  . Hepatitis 07/2018  . History of chicken pox  . IBS (irritable bowel syndrome)  . Kidney stones  . Liver disease  . Migraine  . Nephrolithiasis  . Osteoporosis, post-menopausal  . SVT (supraventricular tachycardia) (CMS-HCC)   Past Surgical History: Past Surgical History:  Procedure Laterality Date  . COLONOSCOPY 11/03/13  MUS. Repeat 5 Years  . CYSTECTOMY OVARY Right 2000  . EGD 11/03/13  MUS  . HYSTERECTOMY 1983  . MASTECTOMY PARTIAL / LUMPECTOMY 1990  . UPPER GASTROINTESTINAL ENDOSCOPY   Past Family History: Family History  Problem Relation Age of Onset  . Rectal cancer Mother  . Coronary Artery Disease (Blocked arteries around heart) Mother  . Myocardial Infarction (Heart attack) Mother  . Osteoporosis (Thinning of bones) Mother  .  Diabetes type II Mother  . High blood pressure (Hypertension) Mother  . Arthritis Mother  . Colon polyps Father  . Leukemia Father  . Asthma Father  . Allergic rhinitis Father  . High blood pressure (Hypertension) Father  . Colon cancer Father  . Arthritis Father  . Colon polyps Brother  . Arthritis Maternal Aunt  . Arthritis Maternal Uncle  . Arthritis Paternal Aunt  . Arthritis Paternal Uncle  . Arthritis Maternal Grandmother  . Arthritis Maternal Grandfather  . Arthritis Paternal Grandmother  . Arthritis Paternal Grandfather   Medications: Current  Outpatient Medications Ordered in Epic  Medication Sig Dispense Refill  . acetaminophen (TYLENOL EXTRA STRENGTH) 500 MG tablet Take by mouth as needed for Pain  . azaTHIOprine (IMURAN) 100 mg tablet Take 200 mg by mouth once daily  . cholecalciferol (VITAMIN D3) 2,000 unit capsule Take 2,000 Units by mouth once daily  . famotidine (PEPCID) 40 MG tablet Take 1 tablet (40 mg total) by mouth nightly 30 tablet 11  . fluticasone propionate (FLONASE) 50 mcg/actuation nasal spray Place 2 sprays into both nostrils once daily  . lidocaine (XYLOCAINE) 2 % jelly Apply topically as needed 30 mL 0  . methocarbamoL (ROBAXIN) 500 MG tablet Take 1 tablet (500 mg total) by mouth 2 (two) times daily as needed for up to 20 days 40 tablet 0  . metoprolol tartrate (LOPRESSOR) 25 MG tablet Take 1 tablet (25 mg total) by mouth 2 (two) times daily 60 tablet 0  . MULTIVITAMIN ORAL Take 1 tablet by mouth once daily  . pantoprazole (PROTONIX) 40 MG DR tablet Take 1 tablet (40 mg total) by mouth once daily 90 tablet 11  . predniSONE (DELTASONE) 5 MG tablet Take 0.5 tablets (2.5 mg total) by mouth once daily 30 tablet 5  . traMADoL (ULTRAM) 50 mg tablet Take 0.5-1 tablets (25-50 mg total) by mouth every 8 (eight) hours as needed for Pain (25 mg (1/2 tablet) for moderate pain (3-6 out of 10), 50 mg (1 tablet) for severe pain (7-10 out of 10)) for up to 30 days 60 tablet 0  . valACYclovir (VALTREX) 1000 MG tablet Take 1 tablet (1,000 mg total) by mouth 2 (two) times daily 20 tablet 5   No current Epic-ordered facility-administered medications on file.   Allergies: Allergies  Allergen Reactions  . Calcium Channel Blocking Agent Diltiazem Analogues Unknown  Elevated LFTs  . Codeine Phosphate Unknown  . Shellfish Containing Products Unknown  . Talwin [Pentazocine Lactate] Unknown  . Others Unknown  Uncoded Allergy. Allergen: Shellfish    Body mass index is 29.08 kg/m.  Review of Systems: A comprehensive 14 point ROS  was performed, reviewed, and the pertinent orthopaedic findings are documented in the HPI.  There were no vitals filed for this visit.   General Physical Examination:   General/Constitutional: No apparent distress: well-nourished and well developed. Eyes: Pupils equal, round with synchronous movement. Lungs: Clear to auscultation HEENT: Normal Vascular: No edema, swelling or tenderness, except as noted in detailed exam. Cardiac: Heart rate and rhythm is regular. Integumentary: No impressive skin lesions present, except as noted in detailed exam. Neuro/Psych: Normal mood and affect, oriented to person, place and time.  Musculoskeletal Examination:  On exam, tenderness to the thoracic spine, more tender on the left side than the right.  Radiographs:  No new imaging studies were obtained or reviewed today.  Assessment: ICD-10-CM  1. Thoracic compression fracture, closed, initial encounter (CMS-HCC) S22.000A   Plan:  The patient has  clinical findings of subacute T8 with probable T7 contusion, possible compression fracture.  We discussed the patient's prior MRI findings. I explained the kyphoplasty procedure and postoperative course in detail. I explained I will send a message to Dr. Ubaldo Glassing regarding the patient's SVT. If Dr. Ubaldo Glassing is okay, I will send a message to my scheduler, and she will call the patient to schedule the surgery.  Surgical Risks:  The nature of the condition and the proposed procedure has been reviewed in detail with the patient. Surgical versus non-surgical options and prognosis for recovery have been reviewed and the inherent risks and benefits of each have been discussed including the risks of infection, bleeding, injury to nerves/blood vessels/tendons, incomplete relief of symptoms, persisting pain and/or stiffness, loss of function, complex regional pain syndrome, failure of the procedure, as appropriate.  Attestation: Leone Payor, am documenting for  [facility], MD utilizing [place].  Reviewed  H+P. No changes noted.

## 2020-03-23 NOTE — OR Nursing (Signed)
Patient questioned if she could use her back brace she came in with today at home.  Per Dr. Rudene Christians, secure-chat, patient may use the back brace she came in with if she wants to, but not mandatory.  Patient notified of same in postop.

## 2020-03-23 NOTE — Transfer of Care (Addendum)
Immediate Anesthesia Transfer of Care Note  Patient: Susan Hickman  Procedure(s) Performed: KYPHOPLASTY (N/A )  Patient Location: PACU  Anesthesia Type:General  Level of Consciousness: awake, alert  and oriented  Airway & Oxygen Therapy: Patient Spontanous Breathing and Patient connected to nasal cannula oxygen  Post-op Assessment: Report given to RN and Post -op Vital signs reviewed and stable  Post vital signs: Reviewed and stable  Last Vitals:  Vitals Value Taken Time  BP 112/51 03/23/20 1123  Temp 36.6 C 03/23/20 1123  Pulse 66 03/23/20 1123  Resp 16 03/23/20 1123  SpO2 98 % 03/23/20 1123    Last Pain:  Vitals:   03/23/20 1123  TempSrc: Temporal  PainSc: 0-No pain         Complications: No complications documented.

## 2020-03-24 ENCOUNTER — Encounter: Payer: Self-pay | Admitting: Orthopedic Surgery

## 2020-03-24 LAB — SURGICAL PATHOLOGY

## 2020-08-17 ENCOUNTER — Other Ambulatory Visit: Payer: Self-pay | Admitting: Internal Medicine

## 2020-08-17 DIAGNOSIS — Z1231 Encounter for screening mammogram for malignant neoplasm of breast: Secondary | ICD-10-CM

## 2020-08-26 ENCOUNTER — Other Ambulatory Visit: Payer: Self-pay

## 2020-08-26 ENCOUNTER — Ambulatory Visit
Admission: RE | Admit: 2020-08-26 | Discharge: 2020-08-26 | Disposition: A | Discharge status: Home / Self Care | Payer: 59 | Source: Ambulatory Visit | Attending: Internal Medicine | Admitting: Internal Medicine

## 2020-08-26 DIAGNOSIS — Z1231 Encounter for screening mammogram for malignant neoplasm of breast: Secondary | ICD-10-CM | POA: Insufficient documentation

## 2020-09-13 NOTE — Care Management Important Message (Signed)
Important Message  Patient Details  Name: Susan Hickman MRN: [id number] Date of Birth: May 12, 1955   Medicare Important Message Given:  Yes     Orbie Pyo 09/13/2020, 3:26 PM

## 2021-06-21 IMAGING — CT CT HEAD W/O CM
1 series · 16 of 30 positions shown, 20 images · non-contrast
Comparison: No pertinent prior studies available for comparison.

CLINICAL DATA: Ataxia. Abnormal LFTs. Supraventricular tachycardia.
Dizzy. Additional history provided by scanning technologist: Patient
reports confusion, dizziness, lightheadedness, nausea intermittently
for 3 months.

EXAM:
CT HEAD WITHOUT CONTRAST
TECHNIQUE: Contiguous axial images were obtained from the base of the skull
through the vertex without intravenous contrast.

[Series 2: head wo · axial · 0.40mm/px · z∈[-125,+10]mm · 16 of 31 slices shown, 20 images]
[im 2/31  brain]
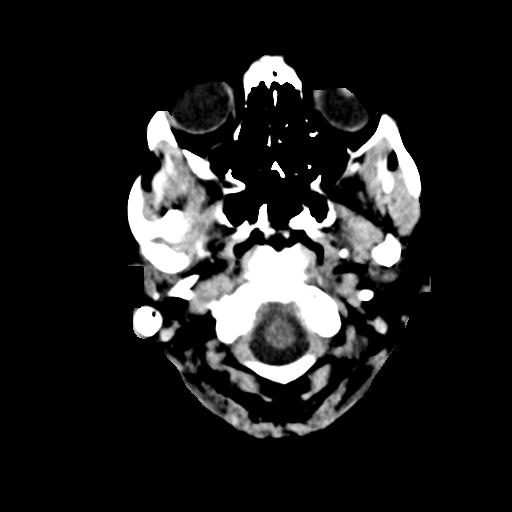
[im 2/31  bone]
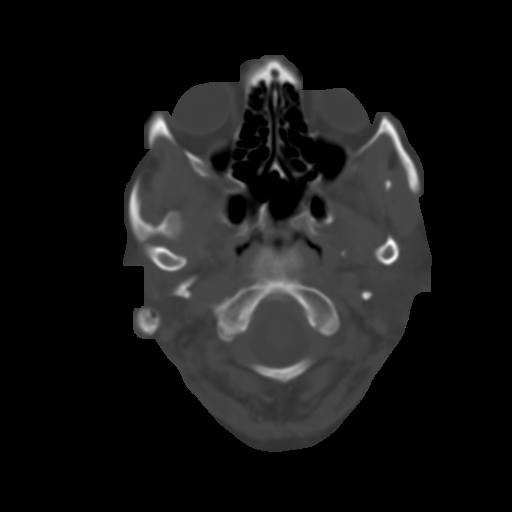
[im 4/31  brain]
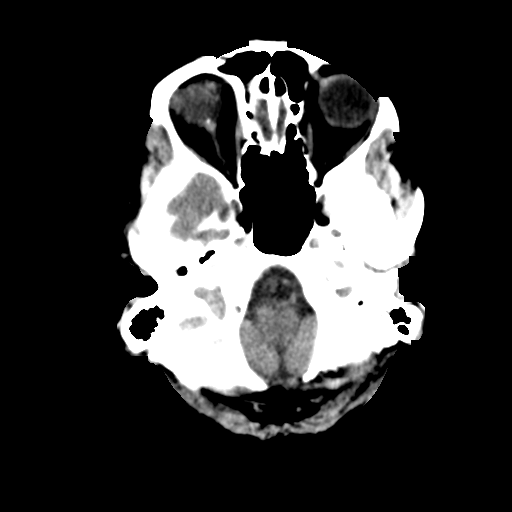
[im 6/31  brain]
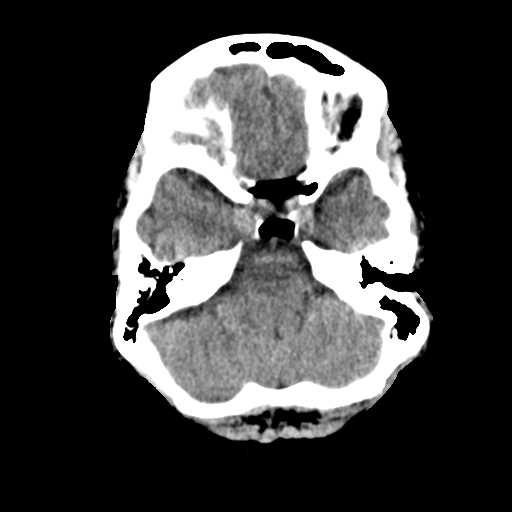
[im 8/31  brain]
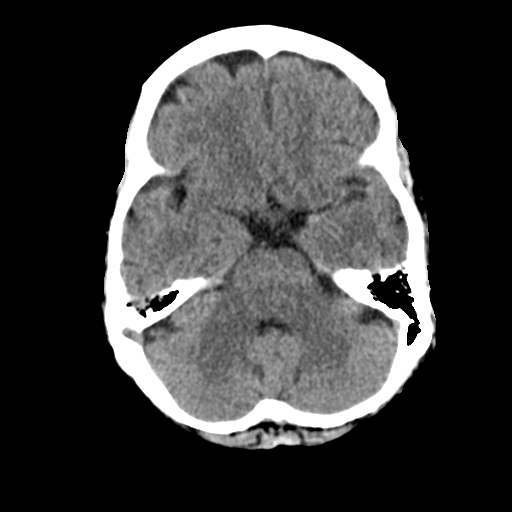
[im 9/31  brain]
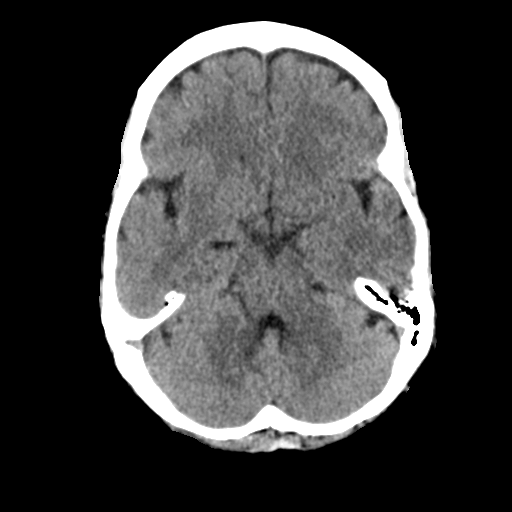
[im 9/31  bone]
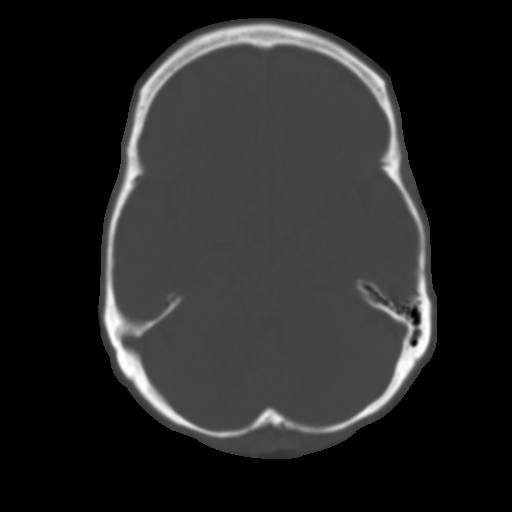
[im 11/31  brain]
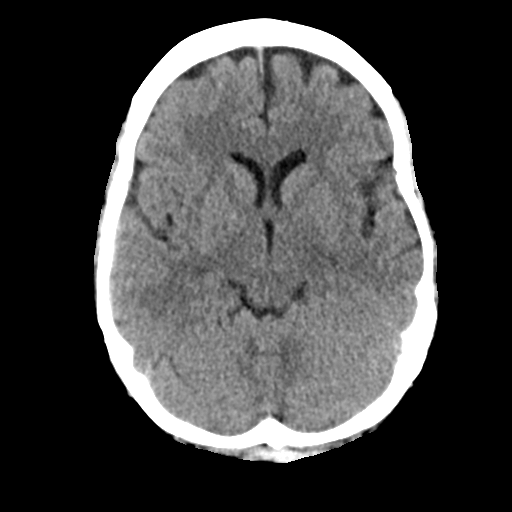
[im 13/31  brain]
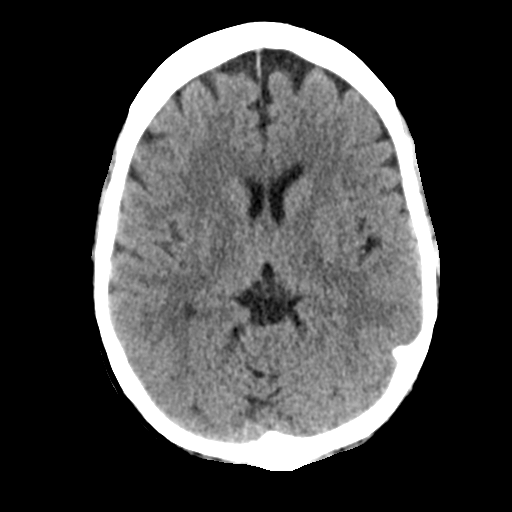
[im 15/31  brain]
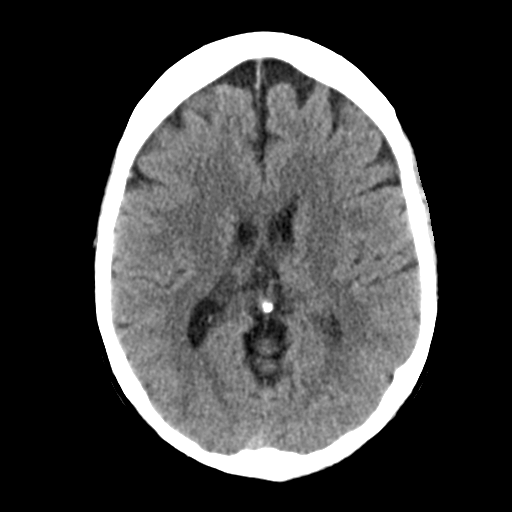
[im 16/31  brain]
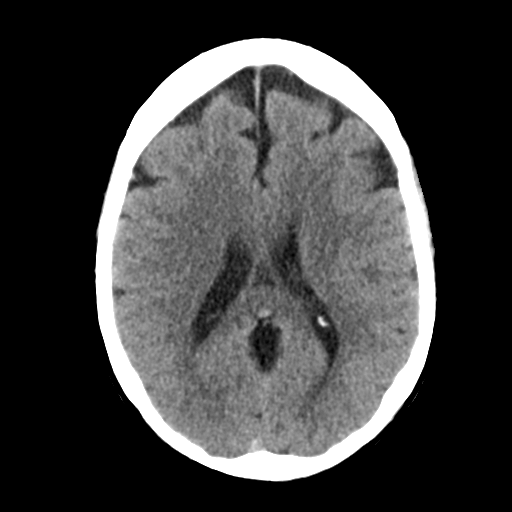
[im 16/31  bone]
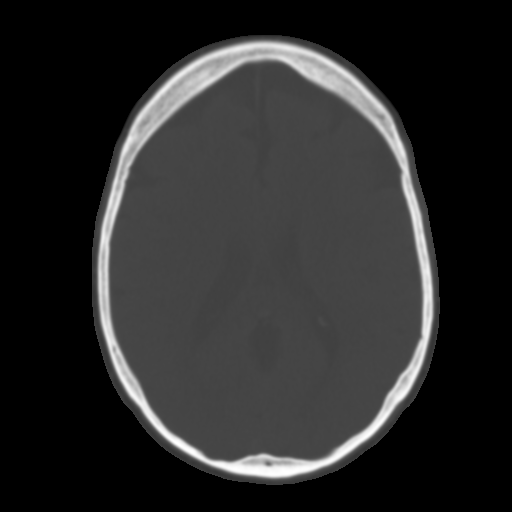
[im 18/31  brain]
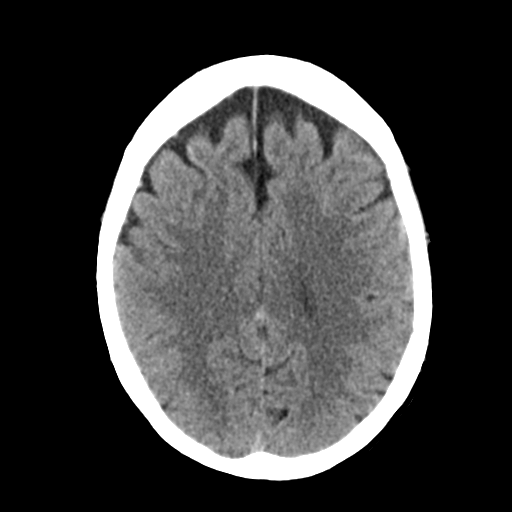
[im 20/31  brain]
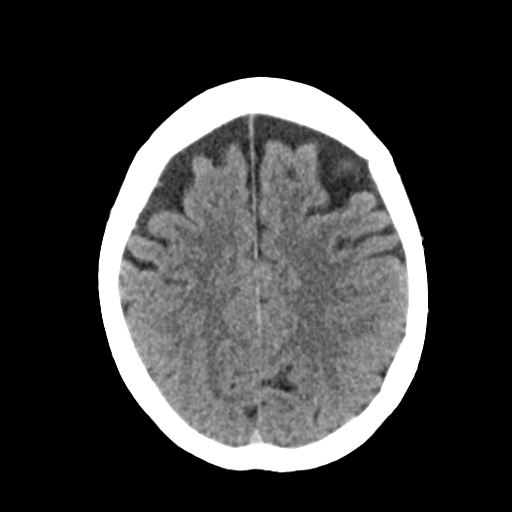
[im 22/31  brain]
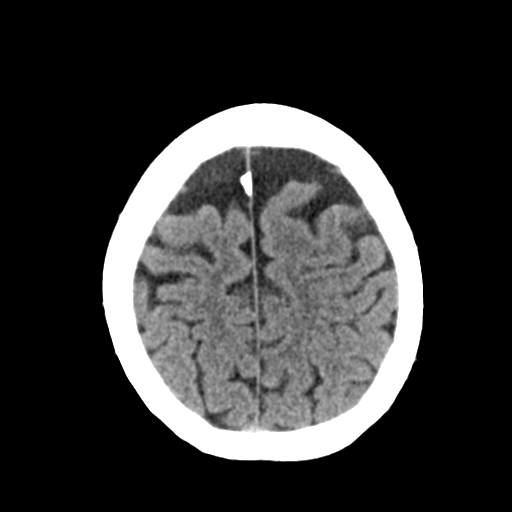
[im 23/31  brain]
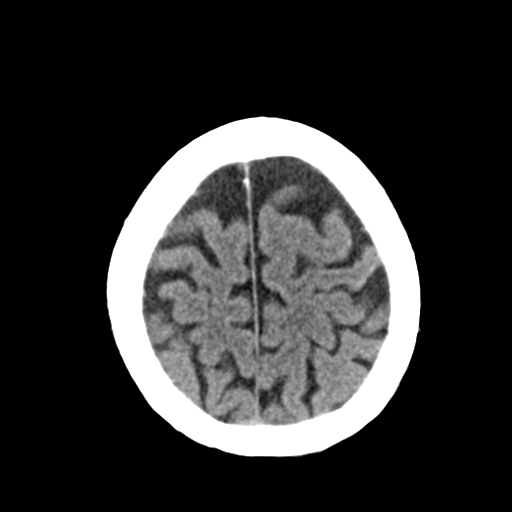
[im 23/31  bone]
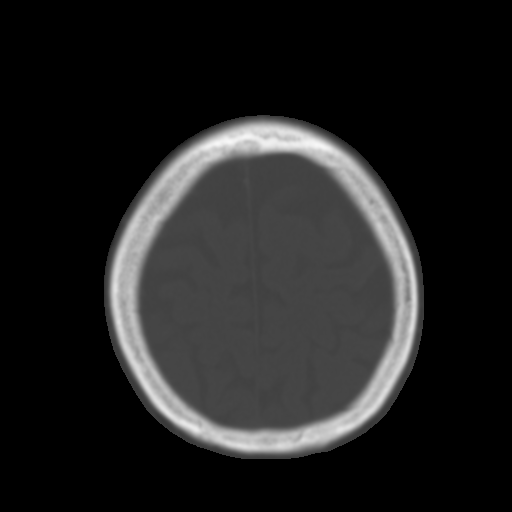
[im 25/31  brain]
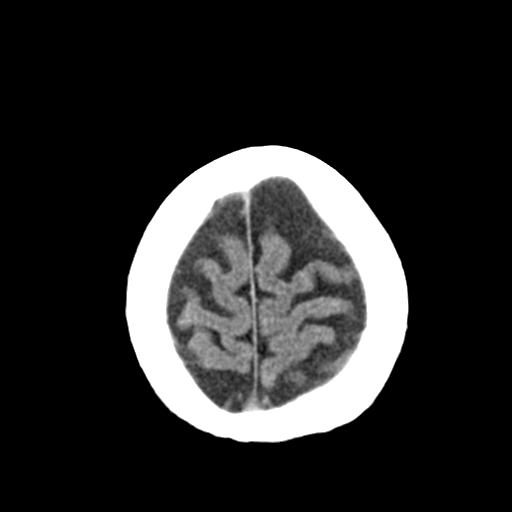
[im 27/31  brain]
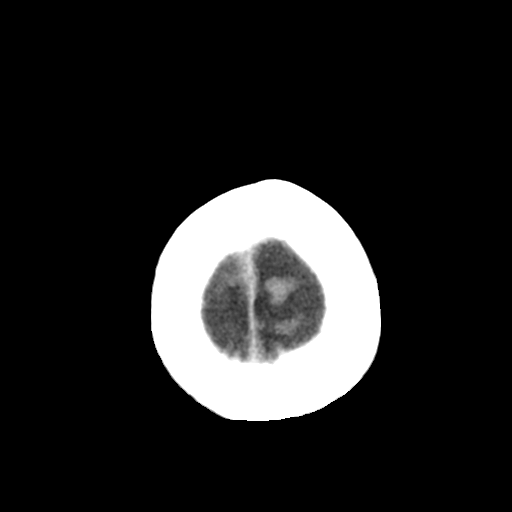
[im 29/31  brain]
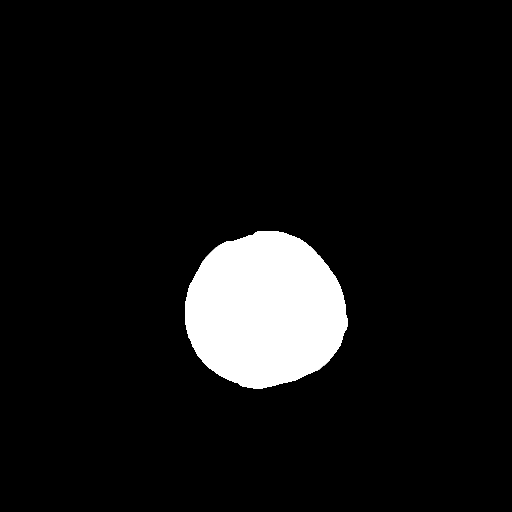

[16 of 30 positions shown; findings below may reference images not displayed]

FINDINGS: Brain:

There is no evidence of acute intracranial hemorrhage, intracranial
mass, midline shift or extra-axial fluid collection. No demarcated
cortical infarction. Mild generalized parenchymal atrophy.

Vascular: No hyperdense vessel.

Skull: Normal. Negative for fracture or focal lesion.

Sinuses/Orbits: Visualized orbits demonstrate no acute abnormality.
No significant paranasal sinus disease or mastoid effusion at the
imaged levels.
IMPRESSION: No evidence of acute intracranial abnormality.

Mild generalized parenchymal atrophy.

## 2021-07-14 ENCOUNTER — Ambulatory Visit: Payer: 59 | Admitting: Dermatology

## 2021-07-28 ENCOUNTER — Ambulatory Visit: Payer: Medicare Other | Admitting: Dermatology

## 2021-07-28 DIAGNOSIS — Z1283 Encounter for screening for malignant neoplasm of skin: Secondary | ICD-10-CM

## 2021-07-28 DIAGNOSIS — L820 Inflamed seborrheic keratosis: Secondary | ICD-10-CM | POA: Diagnosis not present

## 2021-07-28 DIAGNOSIS — L814 Other melanin hyperpigmentation: Secondary | ICD-10-CM

## 2021-07-28 DIAGNOSIS — D1800 Hemangioma unspecified site: Secondary | ICD-10-CM

## 2021-07-28 DIAGNOSIS — L219 Seborrheic dermatitis, unspecified: Secondary | ICD-10-CM | POA: Diagnosis not present

## 2021-07-28 DIAGNOSIS — L720 Epidermal cyst: Secondary | ICD-10-CM

## 2021-07-28 DIAGNOSIS — D229 Melanocytic nevi, unspecified: Secondary | ICD-10-CM

## 2021-07-28 DIAGNOSIS — L578 Other skin changes due to chronic exposure to nonionizing radiation: Secondary | ICD-10-CM

## 2021-07-28 DIAGNOSIS — L821 Other seborrheic keratosis: Secondary | ICD-10-CM

## 2021-07-28 MED ORDER — FLUOCINONIDE 0.05 % EX SOLN: CUTANEOUS | 2 refills | Status: DC

## 2021-07-28 NOTE — Progress Notes (Signed)
? ?New Patient Visit ? ?Subjective  ?Susan Hickman is a 66 y.o. female who presents for the following: TBSE (Patient here for full body skin exam and skin cancer screening. Patient does not have a hx of skin cancer but is on medication for autoimmune that makes her more susceptible for skin cancer. Patient does have a spot at right scalp, right neck, possibly skin tags. ). Spots on neck are irritated and sore. She has been on prednisone and takes Immuran. ? ?Patient did have something removed at back by previous dermatologist, will request records.  ? ?She has hx of seborrheic dermatitis at scalp, some at face and has been prescribed ketoconazole cream and shampoo.  ? ?The following portions of the chart were reviewed this encounter and updated as appropriate:  ?  ?  ? ?Review of Systems:  No other skin or systemic complaints except as noted in HPI or Assessment and Plan. ? ?Objective  ?Well appearing patient in no apparent distress; mood and affect are within normal limits. ? ?A full examination was performed including scalp, head, eyes, ears, nose, lips, neck, chest, axillae, abdomen, back, buttocks, bilateral upper extremities, bilateral lower extremities, hands, feet, fingers, toes, fingernails, and toenails. All findings within normal limits unless otherwise noted below. ? ?Scalp ?Mild pinkness at scalp ? ?right neck (2) ?Tiny waxy pink papules x 2 ? ?right temporal scalp ?Firm white papule ? ? ? ?Assessment & Plan  ?Seborrheic dermatitis ?Scalp ? ?Chronic condition with duration or expected duration over one year. Generally well-controlled. Still some itching. ? ?Seborrheic Dermatitis  ?-  is a chronic persistent rash characterized by pinkness and scaling most commonly of the mid face but also can occur on the scalp (dandruff), ears; mid chest, mid back and groin.  It tends to be exacerbated by stress and cooler weather.  People who have neurologic disease may experience new onset or exacerbation of existing  seborrheic dermatitis.  The condition is not curable but treatable and can be controlled.  ? ?Continue ketoconazole 2% shampoo weekly as needed and ketoconazole 2% cream daily as needed as prescribed by previous dermatologist. Patient will let us know when she needs refills.  ? ?Start fluocinonide solution 1-2 times daily to spot treat affected areas at scalp as needed for itch. Avoid applying to face, groin, and axilla. Use as directed. Long-term use can cause thinning of the skin. ? ?Topical steroids (such as triamcinolone, fluocinolone, fluocinonide, mometasone, clobetasol, halobetasol, betamethasone, hydrocortisone) can cause thinning and lightening of the skin if they are used for too long in the same area. Your physician has selected the right strength medicine for your problem and area affected on the body. Please use your medication only as directed by your physician to prevent side effects.  ? ? ?fluocinonide (LIDEX) 0.05 % external solution - Scalp ?Apply 1-2 times daily to spot treat affected areas at scalp as needed for itch. Avoid applying to face, groin, and axilla. Use as directed. Long-term use can cause thinning of the skin. ? ?Inflamed seborrheic keratosis (2) ?right neck ? ?Destruction of lesion - right neck ? ?Destruction method: cryotherapy   ?Informed consent: discussed and consent obtained   ?Lesion destroyed using liquid nitrogen: Yes   ?Region frozen until ice ball extended beyond lesion: Yes   ?Outcome: patient tolerated procedure well with no complications   ?Post-procedure details: wound care instructions given   ?Additional details:  Prior to procedure, discussed risks of blister formation, small wound, skin dyspigmentation, or rare  scar following cryotherapy. Recommend Vaseline ointment to treated areas while healing.  ? ?Milia ?right temporal scalp ? ?Benign.  Observation.  Call clinic for new or changing lesions.   ? ?Discussed extraction if bothersome ? ? ?Lentigines ?- Scattered tan  macules ?- Due to sun exposure ?- Benign-appearing, observe ?- Recommend daily broad spectrum sunscreen SPF 30+ to sun-exposed areas, reapply every 2 hours as needed. ?- Call for any changes ? ?Seborrheic Keratoses ?- Stuck-on, waxy, tan-brown papules and/or plaques  ?- Benign-appearing ?- Discussed benign etiology and prognosis. ?- Observe ?- Call for any changes ? ?Melanocytic Nevi ?- Tan-brown and/or pink-flesh-colored symmetric macules and papules ?- Benign appearing on exam today ?- Observation ?- Call clinic for new or changing moles ?- Recommend daily use of broad spectrum spf 30+ sunscreen to sun-exposed areas.  ? ?Hemangiomas ?- Red papules ?- Discussed benign nature ?- Observe ?- Call for any changes ? ?Actinic Damage ?- Chronic condition, secondary to cumulative UV/sun exposure ?- diffuse scaly erythematous macules with underlying dyspigmentation ?- Recommend daily broad spectrum sunscreen SPF 30+ to sun-exposed areas, reapply every 2 hours as needed.  ?- Staying in the shade or wearing long sleeves, sun glasses (UVA+UVB protection) and wide brim hats (4-inch brim around the entire circumference of the hat) are also recommended for sun protection.  ?- Call for new or changing lesions. ? ?Skin cancer screening performed today. ? ?Return in about 1 year (around 07/29/2022) for TBSE. ? ?Graciella Belton, RMA, am acting as scribe for Brendolyn Patty, MD . ? ?Documentation: I have reviewed the above documentation for accuracy and completeness, and I agree with the above. ? ?Brendolyn Patty MD  ? ?

## 2021-07-28 NOTE — Patient Instructions (Signed)
Melanoma ABCDEs ? ?Melanoma is the most dangerous type of skin cancer, and is the leading cause of death from skin disease.  You are more likely to develop melanoma if you: ?Have light-colored skin, light-colored eyes, or red or blond hair ?Spend a lot of time in the sun ?Tan regularly, either outdoors or in a tanning bed ?Have had blistering sunburns, especially during childhood ?Have a close family member who has had a melanoma ?Have atypical moles or large birthmarks ? ?Early detection of melanoma is key since treatment is typically straightforward and cure rates are extremely high if we catch it early.  ? ?The first sign of melanoma is often a change in a mole or a new dark spot.  The ABCDE system is a way of remembering the signs of melanoma. ? ?A for asymmetry:  The two halves do not match. ?B for border:  The edges of the growth are irregular. ?C for color:  A mixture of colors are present instead of an even brown color. ?D for diameter:  Melanomas are usually (but not always) greater than 90m - the size of a pencil eraser. ?E for evolution:  The spot keeps changing in size, shape, and color. ? ?Please check your skin once per month between visits. You can use a small mirror in front and a large mirror behind you to keep an eye on the back side or your body.  ? ?If you see any new or changing lesions before your next follow-up, please call to schedule a visit. ? ?Please continue daily skin protection including broad spectrum sunscreen SPF 30+ to sun-exposed areas, reapplying every 2 hours as needed when you're outdoors.   ? ?If You Need Anything After Your Visit ? ?If you have any questions or concerns for your doctor, please call our main line at [phone] press option 4 to reach your doctor's medical assistant. If no one answers, please leave a voicemail as directed and we will return your call as soon as possible. Messages left after 4 pm will be answered the following business day.  ? ?You may also  send [place] message via MyChart. We typically respond to MyChart messages within 1-2 business days. ? ?For prescription refills, please ask your pharmacy to contact our office. Our fax number is [phone] ? ?If you have an urgent issue when the clinic is closed that cannot wait until the next business day, you can page your doctor at the number below.   ? ?Please note that while we do our best to be available for urgent issues outside of office hours, we are not available 24/7.  ? ?If you have an urgent issue and are unable to reach [place] you may choose to seek medical care at your doctor's office, retail clinic, urgent care center, or emergency room. ? ?If you have a medical emergency, please immediately call 911 or go to the emergency department. ? ?Pager Numbers ? ?- Dr. KNehemiah Massed [phone]? ?- Dr. MLaurence Ferrari [phone]? ?- Dr. SNicole Kindred [phone]? ?In the event of inclement weather, please call our main line at [phone] an update on the status of any delays or closures. ? ?Dermatology Medication Tips: ?Please keep the boxes that topical medications come in in order to help keep track of the instructions about where and how to use these. Pharmacies typically print the medication instructions only on the boxes and not directly on the medication tubes.  ? ?If your medication is too expensive, please contact  our office at [phone] option 4 or send [place] a message through [place].  ? ?We are unable to tell what your co-pay for medications will be in advance as this is different depending on your insurance coverage. However, we may be able to find a substitute medication at lower cost or fill out paperwork to get insurance to cover a needed medication.  ? ?If a prior authorization is required to get your medication covered by your insurance company, please allow [place] 1-2 business days to complete this process. ? ?Drug prices often vary depending on where the prescription is filled and some pharmacies may  offer cheaper prices. ? ?The website www.goodrx.com contains coupons for medications through different pharmacies. The prices here do not account for what the cost may be with help from insurance (it may be cheaper with your insurance), but the website can give you the price if you did not use any insurance.  ?- You can print the associated coupon and take it with your prescription to the pharmacy.  ?- You may also stop by our office during regular business hours and pick up a GoodRx coupon card.  ?- If you need your prescription sent electronically to a different pharmacy, notify our office through [facility] or by phone at [phone] option 4. ? ? ? ? ?Si Usted Necesita Algo Despu?s de Su Visita ? ?Tambi?n puede enviarnos un mensaje a trav?s de MyChart. Por lo general respondemos a los mensajes de MyChart en el transcurso de 1 a 2 d?as h?biles. ? ?Para renovar recetas, por favor pida a su farmacia que se ponga en contacto con nuestra oficina. Nuestro n?mero de fax es el [phone]. ? ?Si tiene un asunto urgente cuando la cl?nica est? cerrada y que no puede esperar hasta el siguiente d?a h?bil, puede llamar/localizar a su doctor(a) al n?mero que aparece a continuaci?n.  ? ?Por favor, tenga en cuenta que aunque hacemos todo lo posible para estar disponibles para asuntos urgentes fuera del horario de oficina, no estamos disponibles las 24 horas del d?a, los 7 d?as de la semana.  ? ?Si tiene un problema urgente y no puede comunicarse con nosotros, puede optar por buscar atenci?n m?dica  en el consultorio de su doctor(a), en una cl?nica privada, en un centro de atenci?n urgente o en una sala de emergencias. ? ?Si tiene Engineer, maintenance (IT) m?dica, por favor llame inmediatamente al 911 o vaya a la sala de emergencias. ? ?N?meros de b?per ? ?- Dr. Nehemiah Massed: [phone] ? ?- Dra. Moye: [phone] ? ?- Dra. Nicole Kindred: [phone] ? ?En caso de inclemencias del tiempo, por favor llame a nuestra l?nea principal al  [phone] para una actualizaci?n sobre el estado de cualquier retraso o cierre. ? ?Consejos para la medicaci?n en dermatolog?a: ?Por favor, guarde las cajas en las que vienen los medicamentos de uso t?pico para ayudarle a seguir las instrucciones sobre d?nde y c?mo usarlos. Las farmacias generalmente imprimen las instrucciones del medicamento s?lo en las cajas y no directamente en los tubos del [place].  ? ?Si su medicamento es muy caro, por favor, p?ngase en contacto con Zigmund Daniel llamando al [phone] y presione la opci?n 4 o env?enos un mensaje a trav?s de MyChart.  ? ?No podemos decirle cu?l ser? su copago por los medicamentos por adelantado ya que esto es diferente dependiendo de la cobertura de su seguro. Sin embargo, es posible que podamos encontrar un medicamento sustituto a Electrical engineer un formulario para que el seguro cubra el medicamento  que se considera necesario.  ? ?Si se requiere Ardelia Mems autorizaci?n previa para que su compa??a de seguros [place] su medicamento, por favor perm?tanos de 1 a 2 d?as h?biles para completar este proceso. ? ?Los precios de los medicamentos var?an con frecuencia dependiendo del Environmental consultant de d?nde se surte la receta y alguna farmacias pueden ofrecer precios m?s baratos. ? ?El sitio web www.goodrx.com tiene cupones para medicamentos de Airline pilot. Los precios aqu? no tienen en cuenta lo que podr?a costar con la ayuda del seguro (puede ser m?s barato con su seguro), pero el sitio web puede darle el precio si no utiliz? ning?n seguro.  ?- Puede imprimir el cup?n correspondiente y llevarlo con su receta a la farmacia.  ?- Tambi?n puede pasar por nuestra oficina durante el horario de atenci?n regular y recoger una tarjeta de cupones de GoodRx.  ?- Si necesita que su receta se env?e electr?nicamente a Chiropodist, informe a nuestra oficina a trav?s de MyChart de [place] o por tel?fono llamando al [phone] y presione la opci?n 4. ? ?

## 2022-06-06 ENCOUNTER — Telehealth: Payer: Self-pay

## 2022-06-06 MED ORDER — KETOCONAZOLE 2 % EX CREA: 1.0000 | TOPICAL_CREAM | Freq: Every day | CUTANEOUS | 0 refills | Status: AC

## 2022-06-06 MED ORDER — KETOCONAZOLE 2 % EX SHAM: MEDICATED_SHAMPOO | CUTANEOUS | 0 refills | Status: DC

## 2022-06-06 NOTE — Telephone Encounter (Signed)
Rfs of Ketoconazole Shampoo and Cream to hold patient until yearly follow up appt. aw

## 2022-07-27 ENCOUNTER — Other Ambulatory Visit: Payer: Self-pay | Admitting: Ophthalmology

## 2022-07-27 DIAGNOSIS — H02402 Unspecified ptosis of left eyelid: Secondary | ICD-10-CM

## 2022-07-27 DIAGNOSIS — H5702 Anisocoria: Secondary | ICD-10-CM

## 2022-08-15 ENCOUNTER — Encounter: Payer: Medicare Other | Admitting: Dermatology

## 2022-08-21 ENCOUNTER — Encounter: Payer: Self-pay | Admitting: Ophthalmology

## 2022-08-22 ENCOUNTER — Ambulatory Visit
Admission: RE | Admit: 2022-08-22 | Discharge: 2022-08-22 | Disposition: A | Discharge status: Home / Self Care | Payer: Medicare Other | Source: Ambulatory Visit | Attending: Ophthalmology | Admitting: Ophthalmology

## 2022-08-22 ENCOUNTER — Other Ambulatory Visit: Payer: Medicare Other

## 2022-08-22 DIAGNOSIS — H02402 Unspecified ptosis of left eyelid: Secondary | ICD-10-CM

## 2022-08-22 DIAGNOSIS — H5702 Anisocoria: Secondary | ICD-10-CM

## 2022-08-22 MED ORDER — GADOPICLENOL 0.5 MMOL/ML IV SOLN
7.5000 mL | Freq: Once | INTRAVENOUS | Status: AC | PRN
  Administered 2022-08-22: 6.5 mL via INTRAVENOUS

## 2023-03-05 ENCOUNTER — Ambulatory Visit: Payer: Medicare Other | Admitting: Dermatology

## 2023-03-05 DIAGNOSIS — L649 Androgenic alopecia, unspecified: Secondary | ICD-10-CM | POA: Diagnosis not present

## 2023-03-05 DIAGNOSIS — Z1283 Encounter for screening for malignant neoplasm of skin: Secondary | ICD-10-CM

## 2023-03-05 DIAGNOSIS — W908XXA Exposure to other nonionizing radiation, initial encounter: Secondary | ICD-10-CM

## 2023-03-05 DIAGNOSIS — L814 Other melanin hyperpigmentation: Secondary | ICD-10-CM

## 2023-03-05 DIAGNOSIS — L219 Seborrheic dermatitis, unspecified: Secondary | ICD-10-CM | POA: Diagnosis not present

## 2023-03-05 DIAGNOSIS — L659 Nonscarring hair loss, unspecified: Secondary | ICD-10-CM

## 2023-03-05 DIAGNOSIS — D1801 Hemangioma of skin and subcutaneous tissue: Secondary | ICD-10-CM

## 2023-03-05 DIAGNOSIS — L578 Other skin changes due to chronic exposure to nonionizing radiation: Secondary | ICD-10-CM

## 2023-03-05 DIAGNOSIS — L821 Other seborrheic keratosis: Secondary | ICD-10-CM

## 2023-03-05 DIAGNOSIS — D229 Melanocytic nevi, unspecified: Secondary | ICD-10-CM

## 2023-03-05 MED ORDER — KETOCONAZOLE 2 % EX SHAM: MEDICATED_SHAMPOO | CUTANEOUS | 5 refills | Status: DC

## 2023-03-05 MED ORDER — FLUOCINOLONE ACETONIDE SCALP 0.01 % EX OIL: TOPICAL_OIL | CUTANEOUS | 5 refills | Status: DC

## 2023-03-05 MED ORDER — FLUOCINONIDE 0.05 % EX SOLN: CUTANEOUS | 2 refills | Status: AC

## 2023-03-05 NOTE — Patient Instructions (Addendum)
Consider over the counter women's Rogaine for hair loss, but check with hepatologist to ensure safe to use.    Discuss Minoxidil 2.5 mg once daily and Finasteride 5mg  once daily for hair loss with hepatologist to see if safe to use with autoimmune disease.    Basic OTC daily skin care regimen to prevent photoaging:   Recommend facial moisturizer with sunscreen SPF 30 every morning (OTC brands include CeraVe AM, Neutrogena, Eucerin, Cetaphil, Aveeno, La Roche Posay).  Can also apply a topical Vit C serum which is an antioxidant (OTC brands include CeraVe, La Roche Posay, and The Ordinary) underneath sunscreen in morning. If you are outside during the day in the summer for extended periods, especially swimming and/or sweating, make sure you apply a water resistant facial sunscreen lotion spf 30 or higher.   At night recommend a cream with retinol (a vitamin A derivative which stimulates collagen production) like CeraVe skin renewing retinol serum or ROC retinol correxion cream or Neutrogena rapid wrinkle repair cream. Retinol may cause skin irritation in people with sensitive skin.  Can use it every other day and/or apply on top of a hyaluronic acid (HA) moisturizer/serum (Neutrogena Hydroboost water cream) if better tolerated that way.  Retinol may also help with lightening brown spots.   Our office sells high quality, medically tested skin care lines such as Elta MD sunscreens (with Zinc), and Alastin skin care products, which are very effective in treating photoaging. The Alastin line includes cosmeceutical grade Vit.C serum, HA serum, Elastin stimulating moisturizers/serums, lightening serum, and sunscreens.  If you want prescription treatment, then you would need an appointment (Rx tretinoin and fade creams, Botox, filler injections, laser treatments, etc.) These prescriptions and procedures are not covered by insurance but work very well.   Due to recent changes in healthcare laws, you may see  results of your pathology and/or laboratory studies on MyChart before the doctors have had a chance to review them. We understand that in some cases there may be results that are confusing or concerning to you. Please understand that not all results are received at the same time and often the doctors may need to interpret multiple results in order to provide you with the best plan of care or course of treatment. Therefore, we ask that you please give [place] 2 business days to thoroughly review all your results before contacting the office for clarification. Should we see a critical lab result, you will be contacted sooner.   If You Need Anything After Your Visit  If you have any questions or concerns for your doctor, please call our main line at [phone] and press option 4 to reach your doctor's medical assistant. If no one answers, please leave a voicemail as directed and we will return your call as soon as possible. Messages left after 4 pm will be answered the following business day.   You may also send [place] a message via MyChart. We typically respond to MyChart messages within 1-2 business days.  For prescription refills, please ask your pharmacy to contact our office. Our fax number is [phone].  If you have an urgent issue when the clinic is closed that cannot wait until the next business day, you can page your doctor at the number below.    Please note that while we do our best to be available for urgent issues outside of office hours, we are not available 24/7.   If you have an urgent issue and are unable to reach [place], you  may choose to seek medical care at your doctor's office, retail clinic, urgent care center, or emergency room.  If you have a medical emergency, please immediately call 911 or go to the emergency department.  Pager Numbers  - Dr. Gwen Pounds: [phone]  - Dr. Roseanne Reno: [phone]  - Dr. Katrinka Blazing: [phone]   In the event of inclement weather, please call our  main line at [phone] for an update on the status of any delays or closures.  Dermatology Medication Tips: Please keep the boxes that topical medications come in in order to help keep track of the instructions about where and how to use these. Pharmacies typically print the medication instructions only on the boxes and not directly on the medication tubes.   If your medication is too expensive, please contact our office at [phone] option 4 or send [place] a message through MyChart.   We are unable to tell what your co-pay for medications will be in advance as this is different depending on your insurance coverage. However, we may be able to find a substitute medication at lower cost or fill out paperwork to get insurance to cover a needed medication.   If a prior authorization is required to get your medication covered by your insurance company, please allow [place] 1-2 business days to complete this process.  Drug prices often vary depending on where the prescription is filled and some pharmacies may offer cheaper prices.  The website www.goodrx.com contains coupons for medications through different pharmacies. The prices here do not account for what the cost may be with help from insurance (it may be cheaper with your insurance), but the website can give you the price if you did not use any insurance.  - You can print the associated coupon and take it with your prescription to the pharmacy.  - You may also stop by our office during regular business hours and pick up a GoodRx coupon card.  - If you need your prescription sent electronically to a different pharmacy, notify our office through [facility] or by phone at [phone] option 4.     Si Usted Necesita Algo Despus de Su Visita  Tambin puede enviarnos un mensaje a travs de Clinical cytogeneticist. Por lo general respondemos a los mensajes de MyChart en el transcurso de 1 a 2 das hbiles.  Para renovar recetas, por favor pida a su  farmacia que se ponga en contacto con nuestra oficina. Annie Sable de fax es [place] [phone].  Si tiene un asunto urgente cuando la clnica est cerrada y que no puede esperar hasta el siguiente da hbil, puede llamar/localizar a su doctor(a) al nmero que aparece a continuacin.   Por favor, tenga en cuenta que aunque hacemos todo lo posible para estar disponibles para asuntos urgentes fuera del horario de [place], no estamos disponibles las 24 horas del da, los 7 [address]  [address] de la [address].   Si tiene un problema urgente y no puede comunicarse con nosotros, puede optar por buscar atencin mdica  en el consultorio de su doctor(a), en una clnica privada, en un centro de atencin urgente o en una sala de emergencias.  Si tiene Engineer, drilling, por favor llame inmediatamente al 911 o vaya a la sala de emergencias.  Nmeros de bper  - Dr. Gwen Pounds: [phone]  - Dra. Roseanne Reno: [phone]  - Dr. Katrinka Blazing: [phone]   En caso de inclemencias del tiempo, por favor llame a Lacy Duverney principal al [phone] para una actualizacin sobre el [place] de cualquier [place]  o cierre.  Consejos para la medicacin en dermatologa: Por favor, guarde las cajas en las que vienen los medicamentos de uso tpico para ayudarle a seguir las instrucciones sobre dnde y cmo usarlos. Las farmacias generalmente imprimen las instrucciones del medicamento slo en las cajas y no directamente en los tubos del [place].   Si su medicamento es muy caro, por favor, pngase en contacto con Rolm Gala llamando al [phone] y presione la opcin 4 o envenos un mensaje a travs de Clinical cytogeneticist.   No podemos decirle cul ser su copago por los medicamentos por adelantado ya que esto es diferente dependiendo de la cobertura de su seguro. Sin embargo, es posible que podamos encontrar un medicamento sustituto a Audiological scientist un formulario para que el seguro cubra el medicamento que se considera necesario.    Si se requiere una autorizacin previa para que su compaa de seguros [place] su medicamento, por favor permtanos de 1 a 2 das hbiles para completar [address]  Los precios de los medicamentos varan con frecuencia dependiendo del Environmental consultant de dnde se surte la receta y alguna farmacias pueden ofrecer precios ms baratos.  El sitio web www.goodrx.com tiene cupones para medicamentos de Health and safety inspector. Los precios aqu no tienen en cuenta lo que podra costar con la ayuda del seguro (puede ser ms barato con su seguro), pero el sitio web puede darle el precio si no utiliz Tourist information centre manager.  - Puede imprimir el cupn correspondiente y llevarlo con su receta a la farmacia.  - Tambin puede pasar por nuestra oficina durante el horario de atencin regular y Education officer, museum una tarjeta de cupones de GoodRx.  - Si necesita que su receta se enve electrnicamente a una farmacia diferente, informe a nuestra oficina a travs de MyChart de [place] o por telfono llamando al [phone] y presione la opcin 4.

## 2023-03-05 NOTE — Progress Notes (Signed)
Follow-Up Visit   Subjective  Susan Hickman is a 67 y.o. female who presents for the following: Skin Cancer Screening and Full Body Skin Exam  The patient presents for Total-Body Skin Exam (TBSE) for skin cancer screening and mole check. The patient has spots, moles and lesions to be evaluated, some may be new or changing and the patient may have concern these could be cancer. Pt on Immuran for autoimmune hepatitis.  No longer takes prednisone. She is concerned about hair loss and itchy scalp.   The following portions of the chart were reviewed this encounter and updated as appropriate: medications, allergies, medical history  Review of Systems:  No other skin or systemic complaints except as noted in HPI or Assessment and Plan.  Objective  Well appearing patient in no apparent distress; mood and affect are within normal limits.  A full examination was performed including scalp, head, eyes, ears, nose, lips, neck, chest, axillae, abdomen, back, buttocks, bilateral upper extremities, bilateral lower extremities, hands, feet, fingers, toes, fingernails, and toenails. All findings within normal limits unless otherwise noted below.   Relevant physical exam findings are noted in the Assessment and Plan.    Assessment & Plan   SKIN CANCER SCREENING PERFORMED TODAY.  ACTINIC DAMAGE - Chronic condition, secondary to cumulative UV/sun exposure - diffuse scaly erythematous macules with underlying dyspigmentation - Recommend daily broad spectrum sunscreen SPF 30+ to sun-exposed areas, reapply every 2 hours as needed.  - Staying in the shade or wearing long sleeves, sun glasses (UVA+UVB protection) and wide brim hats (4-inch brim around the entire circumference of the hat) are also recommended for sun protection.  - Call for new or changing lesions.  LENTIGINES, SEBORRHEIC KERATOSES, HEMANGIOMAS - Benign normal skin lesions - Benign-appearing - Call for any changes  MELANOCYTIC NEVI -  Tan-brown and/or pink-flesh-colored symmetric macules and papules - Benign appearing on exam today - Observation - Call clinic for new or changing moles - Recommend daily use of broad spectrum spf 30+ sunscreen to sun-exposed areas.   Seborrheic dermatitis Scalp clear today   Chronic condition with duration or expected duration over one year. Generally well-controlled. Still some itching.   Seborrheic Dermatitis  -  is a chronic persistent rash characterized by pinkness and scaling most commonly of the mid face but also can occur on the scalp (dandruff), ears; mid chest, mid back and groin.  It tends to be exacerbated by stress and cooler weather.  People who have neurologic disease may experience new onset or exacerbation of existing seborrheic dermatitis.  The condition is not curable but treatable and can be controlled.    Continue ketoconazole 2% shampoo weekly as needed and ketoconazole 2% cream daily as needed as prescribed by previous dermatologist. Patient will let us know when she needs refills.   Recommend alternating with T-Sal shampoo.    Continue fluocinonide solution 1-2 times daily to spot treat affected areas at scalp as needed for itch. Avoid applying to face, groin, and axilla. Use as directed. Long-term use can cause thinning of the skin.  Start Derma-Smoothe scalp oil massage into skin on scalp, cover with shower cap, let sit over night, then wash off in the morning. Use 2 nights per week as needed.   Topical steroids (such as triamcinolone, fluocinolone, fluocinonide, mometasone, clobetasol, halobetasol, betamethasone, hydrocortisone) can cause thinning and lightening of the skin if they are used for too long in the same area. Your physician has selected the right strength medicine for your  problem and area affected on the body. Please use your medication only as directed by your physician to prevent side effects.   ANDROGENETIC ALOPECIA (FEMALE PATTERN HAIR LOSS)  Exam:  Diffuse thinning of the crown and widening of the midline part with retention of the frontal hairline  Chronic and persistent condition with duration or expected duration over one year. Condition is symptomatic/ bothersome to patient. Not currently at goal.  - fhx of hair loss in sons   Female Androgenic Alopecia is a chronic condition related to genetics and/or hormonal changes.  In women androgenetic alopecia is commonly associated with menopause but may occur any time after puberty.  It causes hair thinning primarily on the crown with widening of the part and temporal hairline recession.  Can use OTC Rogaine (minoxidil) 5% solution/foam as directed.  Oral treatments in female patients who have no contraindication may include : - Low dose oral minoxidil 1.25 - 5mg  daily - Spironolactone 50 - 100mg  bid - Finasteride 2.5 - 5 mg daily Adjunctive therapies include: - Low Level Laser Light Therapy (LLLT) - Platelet-rich plasma injections (PRP) - Hair Transplants or scalp reduction   Treatment Plan: Will order TSH, ferritin, vitamin D hydroxy. Pt to ask hepatologist if Minoxidil and Finasteride OK to take due to autoimmune hepatitis. Currently on Azathioprine 150 mg daily for autoimmune hepatitis.   Discussed Red light helmets/caps to help stimulate hair growth  Discussed minoxidil 5% (Rogaine for men) solution or foam to be applied to the scalp and left in. This should ideally be used twice daily for best results but it helps with hair regrowth when used at least three times per week. Rogaine initially can cause increased hair shedding for the first few weeks but this will stop with continued use. In studies, people who used minoxidil (Rogaine) for at least 6 months had thicker hair than people who did not. Minoxidil topical (Rogaine) only works as long as it continues to be used. If if it is no longer used then the hair it has been helping to regrow can fall out. Minoxidil topical (Rogaine) can  cause increased facial hair growth which can usually be managed easily with a battery-operated hair trimmer. If facial hair growth is bothersome, switching to the 2% women's version can decrease the risk of unwanted facial hair growth.   Hair loss disorder  Related Procedures Vitamin D, 25-Hydroxy, Total Ferritin TSH  Seborrheic dermatitis  Related Medications fluocinonide (LIDEX) 0.05 % external solution Apply 1-2 times daily to spot treat affected areas at scalp as needed for itch. Avoid applying to face, groin, and axilla. Use as directed. Long-term use can cause thinning of the skin.   Return in about 1 year (around 03/04/2024) for TBSE.  Maylene Roes, CMA, am acting as scribe for Willeen Niece, MD .   Documentation: I have reviewed the above documentation for accuracy and completeness, and I agree with the above.  Willeen Niece, MD

## 2023-07-05 ENCOUNTER — Emergency Department
Admission: EM | Admit: 2023-07-05 | Discharge: 2023-07-06 | Disposition: A | Discharge status: Home / Self Care | Attending: Emergency Medicine | Admitting: Emergency Medicine

## 2023-07-05 ENCOUNTER — Emergency Department

## 2023-07-05 ENCOUNTER — Other Ambulatory Visit: Payer: Self-pay

## 2023-07-05 DIAGNOSIS — R002 Palpitations: Secondary | ICD-10-CM | POA: Diagnosis not present

## 2023-07-05 DIAGNOSIS — R079 Chest pain, unspecified: Secondary | ICD-10-CM | POA: Insufficient documentation

## 2023-07-05 LAB — BASIC METABOLIC PANEL
Anion gap: 8 (ref 5–15)
BUN: 20 mg/dL (ref 8–23)
CO2: 20 mmol/L — ABNORMAL LOW (ref 22–32)
Calcium: 8.8 mg/dL — ABNORMAL LOW (ref 8.9–10.3)
Chloride: 107 mmol/L (ref 98–111)
Creatinine, Ser: 0.9 mg/dL (ref 0.44–1.00)
GFR, Estimated: >60 mL/min (ref >60)
Glucose, Bld: 139 mg/dL — ABNORMAL HIGH (ref 70–99)
Potassium: 3.7 mmol/L (ref 3.5–5.1)
Sodium: 135 mmol/L (ref 135–145)

## 2023-07-05 LAB — CBC
HCT: 41.1 % (ref 36.0–46.0)
Hemoglobin: 13.9 g/dL (ref 12.0–15.0)
MCH: 32 pg (ref 26.0–34.0)
MCHC: 33.8 g/dL (ref 30.0–36.0)
MCV: 94.5 fL (ref 80.0–100.0)
Platelets: 165 10*3/uL (ref 150–400)
RBC: 4.35 MIL/uL (ref 3.87–5.11)
RDW: 13.4 % (ref 11.5–15.5)
WBC: 5.6 10*3/uL (ref 4.0–10.5)
nRBC: 0 % (ref 0.0–0.2)

## 2023-07-05 LAB — TROPONIN I (HIGH SENSITIVITY): Troponin I (High Sensitivity): 3 ng/L (ref <18)

## 2023-07-05 NOTE — ED Triage Notes (Signed)
 Pt to ED via [facility] from home c/o chest pain that started yesterday but got worse today. Pt reports hx of SVT and said that her HR went up to the 140s. PT endorsing nausea and jaw pain. Pt took 324 aspirin prior to EMS arrival.

## 2023-07-06 LAB — TSH: TSH: 5.082 u[IU]/mL — ABNORMAL HIGH (ref 0.350–4.500)

## 2023-07-06 LAB — MAGNESIUM: Magnesium: 2.1 mg/dL (ref 1.7–2.4)

## 2023-07-06 LAB — T4, FREE: Free T4: 1.01 ng/dL (ref 0.61–1.12)

## 2023-07-06 LAB — TROPONIN I (HIGH SENSITIVITY): Troponin I (High Sensitivity): 3 ng/L (ref <18)

## 2023-07-06 NOTE — ED Notes (Signed)
 Reports previous CP is resolved @ this time

## 2023-07-06 NOTE — ED Provider Notes (Signed)
 [facility] Provider Note    Event Date/Time   First MD Initiated Contact with Patient 07/06/23 0001     (approximate)   History   Chest Pain   HPI  Susan Hickman is a 68 y.o. female   Past medical history of SVT, here with 2 discrete episodes of palpitations sensation of heart racing and chest pressure rating up to the left jaw, nausea, shortness of breath during these episodes.  She tried vagal maneuver which she had been taught to do in the past which was unsuccessful but then going outside line of the cool air hit her face makes her symptoms resolved and her heart rate come back down to normal.  She notes that she checked her heart rate and at times it was 150-200s  She has been compliant with her prescribed metoprolol.  There have been no changes recently.  She denies any drug or alcohol use, caffeine use or any other obvious inciting factors.  She had 1 episode yesterday morning and another episode in the evening.  In between these episodes she feels better and asymptomatic.  Independent Historian contributed to assessment above: Husband and son are at bedside to corroborate information past medical history as above       Physical Exam   Triage Vital Signs: ED Triage Vitals  Encounter Vitals Group     BP 07/05/23 2145 105/74     Systolic BP Percentile --      Diastolic BP Percentile --      Pulse Rate 07/05/23 2145 71     Resp 07/05/23 2145 16     Temp 07/05/23 2145 98.2 F (36.8 C)     Temp Source 07/05/23 2145 Oral     SpO2 07/05/23 2145 97 %     Weight 07/05/23 2142 134 lb (60.8 kg)     Height 07/05/23 2142 5' (1.524 m)     Head Circumference --      Peak Flow --      Pain Score 07/05/23 2142 5     Pain Loc --      Pain Education --      Exclude from Growth Chart --     Most recent vital signs: Vitals:   07/06/23 0030 07/06/23 0100  BP: (!) 103/55 106/63  Pulse: 75 63  Resp: 15 14  Temp:    SpO2:      General: Awake, no  distress.  CV:  Good peripheral perfusion.  Resp:  Normal effort.  Abd:  No distention.  Other:  Normal vital signs, normal rate and rhythm, normotensive well-appearing patient nontoxic comfortable and pleasant.  Heart sounds normal rate and rhythm without murmurs, lungs clear to auscultation bilaterally, no tremors, soft benign abdominal exam.   ED Results / Procedures / Treatments   Labs (all labs ordered are listed, but only abnormal results are displayed) Labs Reviewed  BASIC METABOLIC PANEL - Abnormal; Notable for the following components:      Result Value   CO2 20 (*)    Glucose, Bld 139 (*)    Calcium 8.8 (*)    All other components within normal limits  TSH - Abnormal; Notable for the following components:   TSH 5.082 (*)    All other components within normal limits  CBC  MAGNESIUM  T4, FREE  TROPONIN I (HIGH SENSITIVITY)  TROPONIN I (HIGH SENSITIVITY)     I ordered and reviewed the above labs they are notable for cell counts  electrolytes serial troponins unremarkable.  EKG  ED ECG REPORT I, Pilar Jarvis, the attending physician, personally viewed and interpreted this ECG.   Date: 07/06/2023  EKG Time: 2150  Rate: 71  Rhythm: sinus  Axis: nl  Intervals:none  ST&T Change: no stemi    RADIOLOGY I independently reviewed and interpreted chest x-ray and I see no obvious focality pneumothorax I also reviewed radiologist's formal read.   PROCEDURES:  Critical Care performed: No  Procedures   MEDICATIONS ORDERED IN ED: Medications - No data to display  IMPRESSION / MDM / ASSESSMENT AND PLAN / ED COURSE  I reviewed the triage vital signs and the nursing notes.                                Patient's presentation is most consistent with acute presentation with potential threat to life or bodily function.  Differential diagnosis includes, but is not limited to, SVT, dysrhythmia, electrolyte derangement, ACS   The patient is on the cardiac monitor  to evaluate for evidence of arrhythmia and/or significant heart rate changes.  MDM:    She had history of SVT and will seems to be 2 discrete episodes of SVT that resolved with vagal maneuver that she knows works which is cold air to the face.  She is normal rate and rhythm on cardiac monitoring throughout her several hours in the emergency department, check electrolytes troponins thyroid function which appear normal.  She is asymptomatic currently.  She should follow-up with her cardiologist for adjustment of medications as needed.  She understands to return to the emergency department for any new or worsening symptoms or persistent symptomatic palpitations.       FINAL CLINICAL IMPRESSION(S) / ED DIAGNOSES   Final diagnoses:  Nonspecific chest pain  Palpitations     Rx / DC Orders   ED Discharge Orders          Ordered    Ambulatory referral to Cardiology       Comments: If you have not heard from the Cardiology office within the next 72 hours please call [phone].   07/06/23 0122             Note:  This document was prepared using Dragon voice recognition software and may include unintentional dictation errors.    Pilar Jarvis, MD 07/06/23 [phone]

## 2023-07-06 NOTE — Discharge Instructions (Addendum)
 It is likely that you had a couple episodes of SVT.  Your testing in the emergency department did not show any emergency conditions like electrolyte abnormalities or evidence of heart attack.  Your heart rate and rhythm were normal while on monitor in the emergency department.  Please follow-up with your cardiologist for any adjustment of medications or further testing as needed as an outpatient.  Thank you for choosing [place] for your health care today!  Please see your primary doctor this week for a follow up appointment.   If you have any new, worsening, or unexpected symptoms call your doctor right away or come back to the emergency department for reevaluation.  It was my pleasure to care for you today.   Daneil Dan Modesto Charon, MD

## 2023-08-21 ENCOUNTER — Other Ambulatory Visit: Payer: Self-pay | Admitting: Nurse Practitioner

## 2023-08-21 DIAGNOSIS — I471 Supraventricular tachycardia, unspecified: Secondary | ICD-10-CM

## 2023-08-21 DIAGNOSIS — R079 Chest pain, unspecified: Secondary | ICD-10-CM

## 2023-08-21 DIAGNOSIS — R0609 Other forms of dyspnea: Secondary | ICD-10-CM

## 2023-08-21 DIAGNOSIS — Z8249 Family history of ischemic heart disease and other diseases of the circulatory system: Secondary | ICD-10-CM

## 2023-08-28 ENCOUNTER — Ambulatory Visit
Admission: RE | Admit: 2023-08-28 | Discharge: 2023-08-28 | Disposition: A | Discharge status: Home / Self Care | Payer: Self-pay | Source: Ambulatory Visit | Attending: Nurse Practitioner | Admitting: Nurse Practitioner

## 2023-08-28 DIAGNOSIS — Z8249 Family history of ischemic heart disease and other diseases of the circulatory system: Secondary | ICD-10-CM | POA: Insufficient documentation

## 2023-08-28 DIAGNOSIS — I471 Supraventricular tachycardia, unspecified: Secondary | ICD-10-CM | POA: Insufficient documentation

## 2023-08-28 DIAGNOSIS — R079 Chest pain, unspecified: Secondary | ICD-10-CM | POA: Insufficient documentation

## 2023-08-28 DIAGNOSIS — R0609 Other forms of dyspnea: Secondary | ICD-10-CM | POA: Insufficient documentation

## 2024-03-11 ENCOUNTER — Ambulatory Visit: Payer: Medicare Other | Admitting: Dermatology

## 2024-03-13 ENCOUNTER — Ambulatory Visit: Admitting: Dermatology

## 2024-03-13 DIAGNOSIS — W908XXA Exposure to other nonionizing radiation, initial encounter: Secondary | ICD-10-CM

## 2024-03-13 DIAGNOSIS — L659 Nonscarring hair loss, unspecified: Secondary | ICD-10-CM

## 2024-03-13 DIAGNOSIS — D229 Melanocytic nevi, unspecified: Secondary | ICD-10-CM

## 2024-03-13 DIAGNOSIS — D1801 Hemangioma of skin and subcutaneous tissue: Secondary | ICD-10-CM

## 2024-03-13 DIAGNOSIS — L814 Other melanin hyperpigmentation: Secondary | ICD-10-CM

## 2024-03-13 DIAGNOSIS — L578 Other skin changes due to chronic exposure to nonionizing radiation: Secondary | ICD-10-CM | POA: Diagnosis not present

## 2024-03-13 DIAGNOSIS — L739 Follicular disorder, unspecified: Secondary | ICD-10-CM

## 2024-03-13 DIAGNOSIS — Z1283 Encounter for screening for malignant neoplasm of skin: Secondary | ICD-10-CM

## 2024-03-13 DIAGNOSIS — I8393 Asymptomatic varicose veins of bilateral lower extremities: Secondary | ICD-10-CM

## 2024-03-13 DIAGNOSIS — L821 Other seborrheic keratosis: Secondary | ICD-10-CM

## 2024-03-13 DIAGNOSIS — D239 Other benign neoplasm of skin, unspecified: Secondary | ICD-10-CM

## 2024-03-13 DIAGNOSIS — R21 Rash and other nonspecific skin eruption: Secondary | ICD-10-CM

## 2024-03-13 DIAGNOSIS — L649 Androgenic alopecia, unspecified: Secondary | ICD-10-CM

## 2024-03-13 DIAGNOSIS — L219 Seborrheic dermatitis, unspecified: Secondary | ICD-10-CM

## 2024-03-13 MED ORDER — KETOCONAZOLE 2 % EX SHAM: MEDICATED_SHAMPOO | CUTANEOUS | 5 refills | Status: AC

## 2024-03-13 MED ORDER — MINOXIDIL 2.5 MG PO TABS: 1.2500 mg | ORAL_TABLET | Freq: Every day | ORAL | 5 refills | Status: AC

## 2024-03-13 MED ORDER — FLUOCINOLONE ACETONIDE SCALP 0.01 % EX OIL: TOPICAL_OIL | CUTANEOUS | 5 refills | Status: AC

## 2024-03-13 NOTE — Patient Instructions (Addendum)
 Start minoxidil 2.5 mg take 1/2 tablet once daily x 1 month. If no side effects, may increase to 1 full tablet daily. Dsp #30 5Rf.  Doses of oral minoxidil for hair loss are considered 'low dose'. This is because the doses used for hair loss are much lower than the doses which are used for conditions such as high blood pressure (hypertension). The doses used for hypertension are 10-40mg  per day.  Side effects are uncommon at the low doses (up to 2.5 mg/day) used to treat hair loss. Potential side effects, more commonly seen at higher doses, include: Increase in hair growth (hypertrichosis) elsewhere on face and body Temporary hair shedding upon starting medication which may last up to 4 weeks Ankle swelling, fluid retention, rapid weight gain more than 5 pounds Low blood pressure and feeling lightheaded or dizzy when standing up quickly Fast or irregular heartbeat Headaches  Topical steroids (such as triamcinolone, fluocinolone , fluocinonide , mometasone, clobetasol, halobetasol, betamethasone, hydrocortisone) can cause thinning and lightening of the skin if they are used for too long in the same area. Your physician has selected the right strength medicine for your problem and area affected on the body. Please use your medication only as directed by your physician to prevent side effects.   Recommend daily broad spectrum sunscreen SPF 30+ to sun-exposed areas, reapply every 2 hours as needed. Call for new or changing lesions.  Staying in the shade or wearing long sleeves, sun glasses (UVA+UVB protection) and wide brim hats (4-inch brim around the entire circumference of the hat) are also recommended for sun protection.   Basic OTC daily skin care regimen to prevent photoaging:   Recommend facial moisturizer with sunscreen SPF 30 every morning (OTC brands include CeraVe AM, Neutrogena, Eucerin, Cetaphil, Aveeno, La Roche Posay).  Can also apply a topical Vit C serum which is an antioxidant (OTC  brands include CeraVe, La Roche Posay, Neutrogena and The Ordinary) underneath sunscreen in morning. If you are outside during the day in the summer for extended periods, especially swimming and/or sweating, make sure you apply a water resistant facial sunscreen lotion spf 30 or higher.   At night recommend a cream with retinol (a vitamin A derivative which stimulates collagen production) like CeraVe skin renewing retinol serum or ROC retinol correxion cream or Neutrogena rapid wrinkle repair cream. Retinol may cause skin irritation in people with sensitive skin.  Can use it every other day and/or apply on top of a hyaluronic acid (HA) moisturizer/serum (Neutrogena Hydroboost water cream) if better tolerated that way.  Retinol may also help with lightening brown spots.   Our office sells high quality, medically tested skin care lines such as Elta MD sunscreens (with Zinc), and Alastin skin care products, which are very effective in treating photoaging. The Alastin line includes cosmeceutical grade Vit.C serum, HA serum, Elastin stimulating moisturizers/serums, lightening serum, and sunscreens.  If you want prescription treatment, then you would need an appointment (Rx tretinoin and fade creams, Botox, filler injections, laser treatments, etc.) These prescriptions and procedures are not covered by insurance but work very well.    Due to recent changes in healthcare laws, you may see results of your pathology and/or laboratory studies on MyChart before the doctors have had a chance to review them. We understand that in some cases there may be results that are confusing or concerning to you. Please understand that not all results are received at the same time and often the doctors may need to interpret multiple results in  order to provide you with the best plan of care or course of treatment. Therefore, we ask that you please give us  2 business days to thoroughly review all your results before contacting the  office for clarification. Should we see a critical lab result, you will be contacted sooner.   If You Need Anything After Your Visit  If you have any questions or concerns for your doctor, please call our main line at [phone] and press option 4 to reach your doctor's medical assistant. If no one answers, please leave a voicemail as directed and we will return your call as soon as possible. Messages left after 4 pm will be answered the following business day.   You may also send us  a message via MyChart. We typically respond to MyChart messages within 1-2 business days.  For prescription refills, please ask your pharmacy to contact our office. Our fax number is [phone].  If you have an urgent issue when the clinic is closed that cannot wait until the next business day, you can page your doctor at the number below.    Please note that while we do our best to be available for urgent issues outside of office hours, we are not available 24/7.   If you have an urgent issue and are unable to reach us , you may choose to seek medical care at your doctor's office, retail clinic, urgent care center, or emergency room.  If you have a medical emergency, please immediately call 911 or go to the emergency department.  Pager Numbers  - Dr. Hester: [phone]  - Dr. Jackquline: [phone]  - Dr. Claudene: [phone]   - Dr. Raymund: [phone]  In the event of inclement weather, please call our main line at [phone] for an update on the status of any delays or closures.  Dermatology Medication Tips: Please keep the boxes that topical medications come in in order to help keep track of the instructions about where and how to use these. Pharmacies typically print the medication instructions only on the boxes and not directly on the medication tubes.   If your medication is too expensive, please contact our office at [phone] option 4 or send us  a message through MyChart.   We are  unable to tell what your co-pay for medications will be in advance as this is different depending on your insurance coverage. However, we may be able to find a substitute medication at lower cost or fill out paperwork to get insurance to cover a needed medication.   If a prior authorization is required to get your medication covered by your insurance company, please allow us  1-2 business days to complete this process.  Drug prices often vary depending on where the prescription is filled and some pharmacies may offer cheaper prices.  The website www.goodrx.com contains coupons for medications through different pharmacies. The prices here do not account for what the cost may be with help from insurance (it may be cheaper with your insurance), but the website can give you the price if you did not use any insurance.  - You can print the associated coupon and take it with your prescription to the pharmacy.  - You may also stop by our office during regular business hours and pick up a GoodRx coupon card.  - If you need your prescription sent electronically to a different pharmacy, notify our office through [facility] or by phone at [phone] option 4.     Si Usted Necesita Algo Despus de  Su Visita  Tambin puede enviarnos un mensaje a travs de MyChart. Por lo general respondemos a los mensajes de MyChart en el transcurso de 1 a 2 das hbiles.  Para renovar recetas, por favor pida a su farmacia que se ponga en contacto con nuestra oficina. Randi lakes de fax es [place] [phone].  Si tiene un asunto urgente cuando la clnica est cerrada y que no puede esperar hasta el siguiente da hbil, puede llamar/localizar a su doctor(a) al nmero que aparece a continuacin.   Por favor, tenga en cuenta que aunque hacemos todo lo posible para estar disponibles para asuntos urgentes fuera del horario de [place], no estamos disponibles las 24 horas del da, los 7 [address]  [address] de la [place].   Si tiene un  problema urgente y no puede comunicarse con nosotros, puede optar por buscar atencin mdica  en el consultorio de su doctor(a), en una clnica privada, en un centro de atencin urgente o en una sala de emergencias.  Si tiene engineer, drilling, por favor llame inmediatamente al 911 o vaya a la sala de emergencias.  Nmeros de bper  - Dr. Hester: [phone]  - Dra. Jackquline: [phone]  - Dr. Claudene: [phone]  - Dra. Kitts: [phone]  En caso de inclemencias del [place], por favor llame a nuestra lnea principal al [phone] para una actualizacin sobre el estado de cualquier retraso o cierre.  Consejos para la medicacin en dermatologa: Por favor, guarde las cajas en las que vienen los medicamentos de uso tpico para ayudarle a seguir las instrucciones sobre dnde y cmo usarlos. Las farmacias generalmente imprimen las instrucciones del medicamento slo en las cajas y no directamente en los tubos del [employer].   Si su medicamento es muy caro, por favor, pngase en contacto con landry rieger llamando al [phone] y presione la opcin 4 o envenos un mensaje a travs de Clinical Cytogeneticist.   No podemos decirle cul ser su copago por los medicamentos por adelantado ya que esto es diferente dependiendo de la cobertura de su seguro. Sin embargo, es posible que podamos encontrar un medicamento sustituto a audiological scientist un formulario para que el seguro cubra el medicamento que se considera necesario.   Si se requiere una autorizacin previa para que su compaa de seguros cubra su medicamento, por favor permtanos de 1 a 2 das hbiles para completar este proceso.  Los precios de los medicamentos varan con frecuencia dependiendo del environmental consultant de dnde se surte la receta y alguna farmacias pueden ofrecer precios ms baratos.  El sitio web www.goodrx.com tiene cupones para medicamentos de health and safety inspector. Los precios aqu no tienen en cuenta lo que podra costar con la  ayuda del seguro (puede ser ms barato con su seguro), pero el sitio web puede darle el precio si no utiliz tourist information centre manager.  - Puede imprimir el cupn correspondiente y llevarlo con su receta a la farmacia.  - Tambin puede pasar por nuestra oficina durante el horario de atencin regular y education officer, museum una tarjeta de cupones de GoodRx.  - Si necesita que su receta se enve electrnicamente a una farmacia diferente, informe a nuestra oficina a travs de MyChart de [place] o por telfono llamando al [phone] y presione la opcin 4.

## 2024-03-13 NOTE — Progress Notes (Signed)
 Follow-Up Visit   Subjective  Susan Hickman is a 68 y.o. female who presents for the following: Skin Cancer Screening and Full Body Skin Exam; no hx of skin cancer. Patient reports no areas of concern. She is still using treatment for hair loss and seb derm.   The patient presents for Total-Body Skin Exam (TBSE) for skin cancer screening and mole check. The patient has spots, moles and lesions to be evaluated, some may be new or changing and the patient may have concern these could be cancer.    The following portions of the chart were reviewed this encounter and updated as appropriate: medications, allergies, medical history  Review of Systems:  No other skin or systemic complaints except as noted in HPI or Assessment and Plan.  Objective  Well appearing patient in no apparent distress; mood and affect are within normal limits.  A full examination was performed including scalp, head, eyes, ears, nose, lips, neck, chest, axillae, abdomen, back, buttocks, bilateral upper extremities, bilateral lower extremities, hands, feet, fingers, toes, fingernails, and toenails. All findings within normal limits unless otherwise noted below.   Relevant physical exam findings are noted in the Assessment and Plan.    Assessment & Plan   SKIN CANCER SCREENING PERFORMED TODAY.  ACTINIC DAMAGE - Chronic condition, secondary to cumulative UV/sun exposure - diffuse scaly erythematous macules with underlying dyspigmentation - Recommend daily broad spectrum sunscreen SPF 30+ to sun-exposed areas, reapply every 2 hours as needed.  - Staying in the shade or wearing long sleeves, sun glasses (UVA+UVB protection) and wide brim hats (4-inch brim around the entire circumference of the hat) are also recommended for sun protection.  - Call for new or changing lesions.  LENTIGINES, SEBORRHEIC KERATOSES, HEMANGIOMAS - Benign normal skin lesions - Benign-appearing - Call for any changes  MELANOCYTIC NEVI -  Tan-brown and/or pink-flesh-colored symmetric macules and papules - Benign appearing on exam today - Observation - Call clinic for new or changing moles - Recommend daily use of broad spectrum spf 30+ sunscreen to sun-exposed areas.   Seborrheic dermatitis Exam: Scalp with scaling   Chronic and persistent condition with duration or expected duration over one year. Condition is symptomatic/ bothersome to patient. Not currently at goal.   Seborrheic Dermatitis  -  is a chronic persistent rash characterized by pinkness and scaling most commonly of the mid face but also can occur on the scalp (dandruff), ears; mid chest, mid back and groin.  It tends to be exacerbated by stress and cooler weather.  People who have neurologic disease may experience new onset or exacerbation of existing seborrheic dermatitis.  The condition is not curable but treatable and can be controlled.    Treatment Plan:  Continue ketoconazole  2% shampoo weekly as needed and ketoconazole  2% cream daily as needed as prescribed by previous dermatologist. Refills sent for shampoo   Recommend alternating with T-Sal shampoo.    Continue fluocinonide  solution 1-2 times daily to spot treat affected areas at scalp as needed for itch. Avoid applying to face, groin, and axilla. Use as directed. Long-term use can cause thinning of the skin.   Start Derma-Smoothe  scalp oil massage into skin on scalp, cover with shower cap, let sit over night, then wash off in the morning. Use 2 nights per week as needed.    Topical steroids (such as triamcinolone, fluocinolone , fluocinonide , mometasone, clobetasol, halobetasol, betamethasone, hydrocortisone) can cause thinning and lightening of the skin if they are used for too long in the  same area. Your physician has selected the right strength medicine for your problem and area affected on the body. Please use your medication only as directed by your physician to prevent side effects.    ANDROGENETIC  ALOPECIA (FEMALE PATTERN HAIR LOSS)  Exam: Diffuse thinning of the crown and widening of the midline part with retention of the frontal hairline   Chronic and persistent condition with duration or expected duration over one year. Condition is symptomatic/ bothersome to patient. Not currently at goal.   - fhx of hair loss in sons and mother in her 50's.   Female Androgenic Alopecia is a chronic condition related to genetics and/or hormonal changes.  In women androgenetic alopecia is commonly associated with menopause but may occur any time after puberty.  It causes hair thinning primarily on the crown with widening of the part and temporal hairline recession.  Can use OTC Rogaine (minoxidil) 5% solution/foam as directed.  Oral treatments in female patients who have no contraindication may include : - Low dose oral minoxidil 1.25 - 5mg  daily - Spironolactone 50 - 100mg  bid - Finasteride 2.5 - 5 mg daily Adjunctive therapies include: - Low Level Laser Light Therapy (LLLT) - Platelet-rich plasma injections (PRP) - Hair Transplants or scalp reduction    Treatment Plan: - Will order ferritin.  - Start oral Minoxidil; patient will discuss with cardiologist and hepatologist before starting the medication. If not getting results, she will call back to add on oral Finasteride.    Start minoxidil 2.5 mg take 1/2 tablet once daily x 1 month. If no side effects, may increase to 1 full tablet daily. Dsp #30 [id number].  Doses of oral minoxidil for hair loss are considered 'low dose'. This is because the doses used for hair loss are much lower than the doses which are used for conditions such as high blood pressure (hypertension). The doses used for hypertension are 10-40mg  per day.  Side effects are uncommon at the low doses (up to 2.5 mg/day) used to treat hair loss. 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 Condition is symptomatic/ bothersome to patient. Not currently at goal.   Urticaria or hives is a pink to red patchy whelp- like rash of the skin that typically itches and it is the result of histamine release in the skin.   Hives may have multiple causes including stress, medications, infections, and systemic illness.  Sometimes there is a family history of chronic urticaria.   Physical urticarias may be caused by pressure (dermatographism), heat, sun, cold, vibration.  Insect bites can cause papular urticaria. It is often difficult to find the cause of generalized hives.  Statistically, 70% of the time a cause of generalized hives is not found.  Sometimes hives can spontaneously resolve. Other times hives can persist and when it does, and no cause is found, and it has been at least 6 weeks since started, it is called chronic idiopathic urticaria. Antihistamines are the mainstay for treatment.  In severe cases  Xolair injections may be used.   Treatment Plan: - Start OTC antihistamine (I.e Zyrtec or Allegra before bed) for flares of hives or itching. Patient will discuss with pharmacist and PCP which one will work best with her current medications.   - Patient will call back to be seen when itchy rash present for more definitive diagnosis  Varicose Veins/Spider Veins bilateral lower extremities  - Dilated blue, purple or red veins at the lower extremities - Reassured - Smaller vessels can be treated by sclerotherapy (a procedure to inject a  medicine into the veins to make them disappear) if desired, but the treatment is not covered by insurance. Larger vessels may be covered if symptomatic and we would refer to vascular surgeon if treatment desired.  Dilated pore Exam: At the left flank at bra line   Treatment Plan: Benign, observe   HAIR LOSS DISORDER   Related Procedures Ferritin SEBORRHEIC DERMATITIS   Related Medications fluocinonide  (LIDEX ) 0.05 % external solution Apply 1-2 times daily to spot treat affected areas at scalp as needed for itch. Avoid applying to face, groin, and axilla. Use as directed. Long-term use can cause thinning of the skin. Return in about 1 year (around 03/13/2025) for TBSE; hair loss and seb derm.  I, Emerick Ege, CMA am acting as scribe for Rexene Rattler, MD.   Documentation: I have reviewed the above documentation for accuracy and completeness, and I agree with the above.  Rexene Rattler, MD

## 2025-03-16 ENCOUNTER — Encounter: Admitting: Dermatology
